# Patient Record
Sex: Female | Born: 1970 | Race: White | Hispanic: No | Marital: Married | Smoking: Former smoker
Health system: Southern US, Community
[De-identification: ages and names within clinical notes are randomized; demographics above are authoritative.]

## PROBLEM LIST (undated history)

## (undated) DIAGNOSIS — N393 Stress incontinence (female) (male): Secondary | ICD-10-CM

## (undated) DIAGNOSIS — Z973 Presence of spectacles and contact lenses: Secondary | ICD-10-CM

## (undated) DIAGNOSIS — E079 Disorder of thyroid, unspecified: Secondary | ICD-10-CM

## (undated) DIAGNOSIS — T7840XA Allergy, unspecified, initial encounter: Secondary | ICD-10-CM

## (undated) DIAGNOSIS — R519 Headache, unspecified: Secondary | ICD-10-CM

## (undated) DIAGNOSIS — Z972 Presence of dental prosthetic device (complete) (partial): Secondary | ICD-10-CM

## (undated) DIAGNOSIS — E039 Hypothyroidism, unspecified: Secondary | ICD-10-CM

## (undated) DIAGNOSIS — F419 Anxiety disorder, unspecified: Secondary | ICD-10-CM

## (undated) HISTORY — PX: OTHER SURGICAL HISTORY: SHX169

## (undated) HISTORY — PX: WISDOM TOOTH EXTRACTION: SHX21

## (undated) HISTORY — DX: Allergy, unspecified, initial encounter: T78.40XA

## (undated) HISTORY — DX: Disorder of thyroid, unspecified: E07.9

---

## 2001-11-02 ENCOUNTER — Other Ambulatory Visit: Admission: RE | Admit: 2001-11-02 | Discharge: 2001-11-02 | Payer: Self-pay | Admitting: Obstetrics and Gynecology

## 2002-11-09 ENCOUNTER — Other Ambulatory Visit: Admission: RE | Admit: 2002-11-09 | Discharge: 2002-11-09 | Payer: Self-pay | Admitting: Obstetrics and Gynecology

## 2005-01-11 ENCOUNTER — Other Ambulatory Visit: Admission: RE | Admit: 2005-01-11 | Discharge: 2005-01-11 | Payer: Self-pay | Admitting: Family Medicine

## 2006-01-23 ENCOUNTER — Other Ambulatory Visit: Admission: RE | Admit: 2006-01-23 | Discharge: 2006-01-23 | Payer: Self-pay | Admitting: Family Medicine

## 2007-09-13 ENCOUNTER — Emergency Department (HOSPITAL_COMMUNITY): Admission: EM | Admit: 2007-09-13 | Discharge: 2007-09-13 | Payer: Self-pay | Admitting: Emergency Medicine

## 2011-09-25 ENCOUNTER — Ambulatory Visit (INDEPENDENT_AMBULATORY_CARE_PROVIDER_SITE_OTHER): Payer: 59

## 2011-09-25 DIAGNOSIS — J111 Influenza due to unidentified influenza virus with other respiratory manifestations: Secondary | ICD-10-CM

## 2011-12-13 ENCOUNTER — Encounter: Payer: Self-pay | Admitting: Family Medicine

## 2011-12-13 ENCOUNTER — Ambulatory Visit (INDEPENDENT_AMBULATORY_CARE_PROVIDER_SITE_OTHER): Payer: 59 | Admitting: Family Medicine

## 2011-12-13 VITALS — BP 109/63 | HR 72 | Temp 97.9°F | Resp 16 | Ht 66.0 in | Wt 173.8 lb

## 2011-12-13 DIAGNOSIS — E663 Overweight: Secondary | ICD-10-CM

## 2011-12-13 DIAGNOSIS — E039 Hypothyroidism, unspecified: Secondary | ICD-10-CM

## 2011-12-13 MED ORDER — LEVOTHYROXINE SODIUM 125 MCG PO TABS: 125.0000 ug | ORAL_TABLET | Freq: Every day | ORAL | Status: DC

## 2011-12-13 NOTE — Patient Instructions (Signed)
Reading Food Labels Foods that are in packaging or containers will often have a Nutrition Facts panel on its side or back. The Nutrition Facts panel provides the nutritional value of the food. This information is helpful when determining healthy food choices. By reading food labels, you will find out the serving size of a food and how many servings the package has. You will also find information about the calorie and fat content, as well as the amount of carbohydrate, and vitamins and minerals. Food labels are a great reference for you to use to learn about the food you are eating. BREAKING DOWN THE FOOD LABEL Serving Size: The serving size is an amount of food and is often listed in cups, weight, or units. All of the nutrition information about the food is listed according to the serving size. If you double the serving size, you must double the amounts on the label.  Servings per [employer] or Package: The number of servings in the container is listed here.  Calories: The number of calories in one serving is listed here. Everyone needs a different amount of calories each day. Having calories listed on the label is helpful information for people who would like to keep track of the number of calories they eat to stay at a healthy weight. Calories from Fat:  The number of calories that come from fat in one serving are listed here.  NUTRIENTS THAT ARE LISTED ON THE FOOD LABEL.   Percent Daily Value: The food label helps you know if you are getting the amounts of nutrients you need each day by the percent daily value. It tells you how much of your daily values of each nutrient are provided by one serving of the food. The percent daily value is based on a 2000 calorie diet. You may need more or less than 2000 calories each day.   Total Fat: The total amount of fat in one serving is listed here. The number is shown in grams (g). This information is important for people who want to keep track of the amount of fat  in their diet. Foods with high amounts of fat usually have higher calories and may lead to weight gain.   Saturated Fat:  The amount of saturated fat in one serving is listed here. It is also shown in grams. Saturated fat is one type of fat that is found in food. It increases the amount of blood cholesterol more than other types of fat found in food. So saturated fat should be limited in the diet to less than 7 percent of total calories each day for most people. This means that if a person eats 2000 calories each day, they should eat less than 140 calories from saturated fat.   Trans Fat: The amount of trans fat in one serving is listed here. It is also shown in grams. Trans fat is another type of fat that is found in food. It should also be limited to less than 2 grams per day because it increases blood cholesterol.   Cholesterol: The amount of cholesterol in one serving is listed here. It is shown in milligrams (mg). Cholesterol should be limited to no more than 200 mg each day.   Sodium: The amount of sodium in one serving is listed here. It is shown in milligrams. American Heart Association recommends that sodium should be limited to 1500mg /day. This recommended level of sodium was recently lowered from 2400mg /day.   Total Carbohydrate: The amount of carbohydrate in  one serving is listed here. It is shown in grams. This information is important for people with diabetes because they need to manage the amount of carbohydrate they eat. Carbohydrate changes the amount of glucose or sugar in the blood and diabetics do not want that amount to be too high or too low.   Dietary Fiber:  The amount of dietary fiber in one serving is listed here. It is shown in grams. Fiber is a type of carbohydrate. Most people should eat 25 grams of dietary fiber each day.   Sugars: The amount of sugar in one serving is listed here. It is shown in grams. Sugars are also a type of carbohydrate. This value includes both  naturally occurring sugars from fruit and milk and added sugars such as honey or table sugar.   Protein: The amount of protein in one serving is listed here. It is shown in grams.   Vitamins and Minerals: Food labels list vitamin A, vitamin C, calcium and iron. They are all shown as a percent of the daily need one serving of the food provides. For example, if 15% is listed next to iron it means that one serving of that food will give you 15% of the total amount of iron you need for one day.   Calories per Gram: Some food labels will list the number of calories that are in each gram or protein, carbohydrate and fat. Protein has four calories per gram, carbohydrate has four calories per gram, and fat has 9 calories per gram.   Ingredients: Food labels will list each ingredient in the food. The first ingredient listed is the ingredient that the food has the most of. The ingredients are listed in the order of their amount from highest to lowest.   Contains: Food labels may also include this portion of the label as a food allergen warning. Listed here are ingredients that can cause allergies in some people. Examples of ingredients that are listed are wheat, dairy, eggs, soy and nuts. If a person knows that are allergic to one of these ingredients they will know not to eat the food in the container.  Information from www.eatright.[employer] Servant Nutritional Analysis Database, ADA Nutrition Care Manual. Document Released: 10/07/2005 Document Revised: 06/19/2011 Document Reviewed: 02/20/2009 [employer] Patient Information 2012 [employer]    Obesity Obesity is defined as having a body mass index (BMI) of 30 or more. To calculate your BMI divide your weight in pounds by your height in inches squared and multiply that product by 703. Major illnesses resulting from long-term obesity include:  Stroke.   Heart disease.   Diabetes.   Many cancers.   Arthritis.  Obesity also complicates recovery from  many other medical problems.  CAUSES   A history of obesity in your parents.   Thyroid hormone imbalance.   Environmental factors such as excess calorie intake and physical inactivity.  TREATMENT  A healthy weight loss program includes:  A calorie restricted diet based on individual calorie needs.   Increased physical activity (exercise).  An exercise program is just as important as the right low-calorie diet.  Weight-loss medicines should be used only under the supervision of your physician. These medicines help, but only if they are used with diet and exercise programs. Medicines can have side effects including nervousness, nausea, abdominal pain, diarrhea, headache, drowsiness, and depression.  An unhealthy weight loss program includes:  Fasting.   Fad diets.   Supplements and drugs.  These choices do not succeed  in long-term weight control.  HOME CARE INSTRUCTIONS  To help you make the needed dietary changes:   Exercise and perform physical activity as directed by your caregiver.   Keep a daily record of everything you eat. There are many free websites to help you with this. It may be helpful to measure your foods so you can determine if you are eating the correct portion sizes.   Use low-calorie cookbooks or take special cooking classes.   Avoid alcohol. Drink more water and drinks with no calories.   Take vitamins and supplements only as recommended by your caregiver.   Weight loss support groups, Registered Dieticians, counselors, and stress reduction education can also be very helpful.  Document Released: 11/14/2004 Document Revised: 06/19/2011 Document Reviewed: 09/13/2007 [facility] Patient Information 2012 [place], [place].    .Crohn's Disease Crohn's disease is a long-term (chronic) soreness and redness (inflammation) of the intestines (bowel). It can affect any portion of the digestive tract, from the mouth to the anus. It can also cause problems outside the  digestive tract. Crohn's disease is closely related to a disease called ulcerative colitis (together, these two diseases are called inflammatory bowel disease).  CAUSES  The cause of Crohn's disease is not known. One Nelva Bush is that, in an easily affected (susceptible) person, the immune system is triggered to attack the body's own digestive tissue. Crohn's disease runs in families. It seems to be more common in certain geographic areas and amongst certain races. There are no clear-cut dietary causes.  SYMPTOMS  Crohn's disease can cause many different symptoms since it can affect many different parts of the body. Symptoms include:  Fatigue.   Weight loss.   Chronic diarrhea, sometime bloody.   Abdominal pain and cramps.   Fever.   Ulcers or canker sores in the mouth or rectum.   Anemia (low red blood cells).   Arthritis, skin problems, and eye problems may occur.  Complications of Crohn's disease can include:  Series of holes (perforation) of the bowel.   Portions of the intestines sticking to each other (adhesions).   Obstruction of the bowel.   Fistula formation, typically in the rectal area but also in other areas. A fistula is an opening between the bowels and the outside, or between the bowels and another organ.   A painful crack in the mucous membrane of the anus (rectal fissure).  DIAGNOSIS  Your caregiver may suspect Crohn's disease based on your symptoms and an exam. Blood tests may confirm that there is a problem. You may be asked to submit a stool specimen for examination. X-rays and CT scans may be necessary. Ultimately, the diagnosis is usually made after a procedure that uses a flexible tube that is inserted via your mouth or your anus. This is done under sedation and is called either an upper endoscopy or colonoscopy. With these tests, the specialist can take tiny tissue samples and remove them from the inside of the bowel (biopsy). Examination of this biopsy tissue  under a microscope can reveal Crohn's disease as the cause of your symptoms. Due to the many different forms that Crohn's disease can take, symptoms may be present for several years before a diagnosis is made. HOME CARE INSTRUCTIONS   There is no cure for Crohn's disease. The best treatment is frequent checkups with your caregiver.   Symptoms such as diarrhea can be controlled with medications. Avoid foods that have a laxative effect such as fresh fruit, vegetables and dairy products. During flare ups,  you can rest your bowel by refraining from solid foods. Drink clear liquids frequently during the day (electrolyte or re-hydrating fluids are best. Your caregiver can help you with suggestions). Drink often to prevent loss of body fluids (dehydration). When diarrhea has cleared, eat small meals and more frequently. Avoid food additives and stimulants such as caffeine (coffee, tea, or chocolate). Enzyme supplements may help if you develop intolerance to a sugar in dairy products (lactose). Ask your caregiver or dietitian about specific dietary instructions.   Try to maintain a positive attitude. Learn relaxation techniques such as self hypnosis, mental imaging, and muscle relaxation.   If possible, avoid stresses which can aggravate your condition.   Exercise regularly.   Follow your diet.   Always get plenty of rest.  SEEK MEDICAL CARE IF:   Your symptoms fail to improve after a week or two of new treatment.   You experience continued weight loss.   You have ongoing crampy digestion or loose bowels.   You develop a new skin rash, skin sores, or eye problems.  SEEK IMMEDIATE MEDICAL CARE IF:   You have worsening of your symptoms or develop new symptoms.   You have a fever.   You develop bloody diarrhea.   You develop severe abdominal pain.  MAKE SURE YOU:   Understand these instructions.   Will watch your condition.   Will get help right away if you are not doing well or get  worse.  Document Released: 07/17/2005 Document Revised: 06/19/2011 Document Reviewed: 06/15/2007 [facility] Patient Information 2012 [place], [place].

## 2012-01-17 ENCOUNTER — Encounter: Payer: 59 | Admitting: Family Medicine

## 2012-02-14 ENCOUNTER — Ambulatory Visit (INDEPENDENT_AMBULATORY_CARE_PROVIDER_SITE_OTHER): Payer: 59 | Admitting: Family Medicine

## 2012-02-14 ENCOUNTER — Encounter: Payer: Self-pay | Admitting: Family Medicine

## 2012-02-14 VITALS — BP 122/78 | HR 71 | Temp 98.0°F | Resp 18 | Ht 66.5 in | Wt 176.4 lb

## 2012-02-14 DIAGNOSIS — R32 Unspecified urinary incontinence: Secondary | ICD-10-CM

## 2012-02-14 DIAGNOSIS — Z0000 Encounter for general adult medical examination without abnormal findings: Secondary | ICD-10-CM

## 2012-02-14 DIAGNOSIS — Z13220 Encounter for screening for lipoid disorders: Secondary | ICD-10-CM

## 2012-02-14 DIAGNOSIS — Z8669 Personal history of other diseases of the nervous system and sense organs: Secondary | ICD-10-CM

## 2012-02-14 DIAGNOSIS — J310 Chronic rhinitis: Secondary | ICD-10-CM

## 2012-02-14 DIAGNOSIS — E663 Overweight: Secondary | ICD-10-CM

## 2012-02-14 DIAGNOSIS — K5909 Other constipation: Secondary | ICD-10-CM

## 2012-02-14 DIAGNOSIS — K5904 Chronic idiopathic constipation: Secondary | ICD-10-CM

## 2012-02-14 DIAGNOSIS — Z113 Encounter for screening for infections with a predominantly sexual mode of transmission: Secondary | ICD-10-CM

## 2012-02-14 DIAGNOSIS — E039 Hypothyroidism, unspecified: Secondary | ICD-10-CM

## 2012-02-14 LAB — POCT URINALYSIS DIPSTICK
Bilirubin, UA: NEGATIVE
Blood, UA: NEGATIVE
Leukocytes, UA: NEGATIVE
Nitrite, UA: NEGATIVE
Protein, UA: NEGATIVE
Urobilinogen, UA: 1
pH, UA: 5.5

## 2012-02-14 NOTE — Patient Instructions (Signed)
Keeping You Healthy  Get These Tests 1. Blood Pressure- Have your blood pressure checked once a year by your health care provider.  Normal blood pressure is 120/80. 2. Weight- Have your body mass index (BMI) calculated to screen for obesity.  BMI is measure of body fat based on height and weight.  You can also calculate your own BMI at https://www.west-esparza.com/. 3. Cholesterol- Have your cholesterol checked every 5 years starting at age 41 then yearly starting at age 2. 4. Chlamydia, HIV, and other sexually transmitted diseases- Get screened every year until age 75, then within three months of each new sexual provider. 5. Pap Smear- Every 1-3 years; discuss with your health care provider. 6. Mammogram- Every year starting at age 45  Take these medicines  Calcium with Vitamin D-Your body needs 1200 mg of Calcium each day and [phone] IU of Vitamin D daily.  Your body can only absorb 500 mg of Calcium at a time so Calcium must be taken in 2 or 3 divided doses throughout the day.  Multivitamin with folic acid- Once daily if it is possible for you to become pregnant.  Get these Immunizations  Gardasil-Series of three doses; prevents HPV related illness such as genital warts and cervical cancer.  Menactra-Single dose; prevents meningitis.  Tetanus shot- Every 10 years.  Flu shot-Every year.  Take these steps 1. Do not smoke-Your healthcare provider can help you quit.  For tips on how to quit go to www.smokefree.gov or call 1-800 QUITNOW. 2. Be physically active- Exercise 5 days a week for at least 30 minutes.  If you are not already physically active, start slow and gradually work up to 30 minutes of moderate physical activity.  Examples of moderate activity include walking briskly, dancing, swimming, bicycling, etc. 3. Breast Cancer- A self breast exam every month is important for early detection of breast cancer.  For more information and instruction on self breast exams, ask your  healthcare provider or [email or web address]. 4. Eat a healthy diet- Eat a variety of healthy foods such as fruits, vegetables, whole grains, low fat milk, low fat cheeses, yogurt, lean meats, poultry and fish, beans, nuts, tofu, etc.  For more information go to www. Thenutritionsource.org 5. Drink alcohol in moderation- Limit alcohol intake to one drink or less per day. Never drink and drive. 6. Depression- Your emotional health is as important as your physical health.  If you're feeling down or losing interest in things you normally enjoy please talk to your healthcare provider about being screened for depression. 7. Dental visit- Brush and floss your teeth twice daily; visit your dentist twice a year. 8. Eye doctor- Get an eye exam at least every 2 years. 9. Helmet use- Always wear a helmet when riding a bicycle, motorcycle, rollerblading or skateboarding. 10. Safe sex- If you may be exposed to sexually transmitted infections, use a condom. 11. Seat belts- Seat belts can save your live; always wear one. 12. Smoke/Carbon Monoxide detectors- These detectors need to be installed on the appropriate level of your home. Replace batteries at least once a year. 13. Skin cancer- When out in the sun please cover up and use sunscreen 15 SPF or higher. 14. Violence- If anyone is threatening or hurting you, please tell your healthcare provider.   Constipation- see additional information provided to you. Changing your diet and trying to be active helps colon function properly. You can try OTC Magnesium citrate solution to"clean out" your colon. There are a number of reasons people experience  chronic constipation. Increasing your fluid intake, especially with water and healthy fruit juices as well as eating fruits,vegetables and whole grains can improve your colon health. If you need to see a GI specialist, you will be notified.
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
## 2012-02-15 LAB — COMPREHENSIVE METABOLIC PANEL
Alkaline Phosphatase: 44 U/L (ref 39–117)
BUN: 9 mg/dL (ref 6–23)
Creat: 0.68 mg/dL (ref 0.50–1.10)
Glucose, Bld: 79 mg/dL (ref 70–99)
Sodium: 137 mEq/L (ref 135–145)
Total Bilirubin: 1.3 mg/dL — ABNORMAL HIGH (ref 0.3–1.2)
Total Protein: 6.7 g/dL (ref 6.0–8.3)

## 2012-02-15 LAB — CBC WITH DIFFERENTIAL/PLATELET
Basophils Absolute: 0 10*3/uL (ref 0.0–0.1)
Eosinophils Absolute: 0.1 10*3/uL (ref 0.0–0.7)
Eosinophils Relative: 1 % (ref 0–5)
HCT: 43.6 % (ref 36.0–46.0)
MCH: 31.5 pg (ref 26.0–34.0)
MCHC: 33 g/dL (ref 30.0–36.0)
MCV: 95.4 fL (ref 78.0–100.0)
Monocytes Absolute: 0.5 10*3/uL (ref 0.1–1.0)
Platelets: 241 10*3/uL (ref 150–400)
RDW: 12.4 % (ref 11.5–15.5)

## 2012-02-15 LAB — RPR

## 2012-02-15 LAB — LIPID PANEL
Cholesterol: 167 mg/dL (ref 0–200)
HDL: 53 mg/dL (ref >39)
Total CHOL/HDL Ratio: 3.2 Ratio
Triglycerides: 60 mg/dL (ref <150)

## 2012-02-18 ENCOUNTER — Encounter: Payer: Self-pay | Admitting: Family Medicine

## 2012-02-18 DIAGNOSIS — J310 Chronic rhinitis: Secondary | ICD-10-CM | POA: Insufficient documentation

## 2012-02-18 DIAGNOSIS — Z0000 Encounter for general adult medical examination without abnormal findings: Secondary | ICD-10-CM | POA: Insufficient documentation

## 2012-02-18 DIAGNOSIS — K5904 Chronic idiopathic constipation: Secondary | ICD-10-CM | POA: Insufficient documentation

## 2012-02-18 DIAGNOSIS — E039 Hypothyroidism, unspecified: Secondary | ICD-10-CM | POA: Insufficient documentation

## 2012-02-18 DIAGNOSIS — Z8669 Personal history of other diseases of the nervous system and sense organs: Secondary | ICD-10-CM | POA: Insufficient documentation

## 2012-02-18 DIAGNOSIS — E663 Overweight: Secondary | ICD-10-CM | POA: Insufficient documentation

## 2012-02-18 NOTE — Progress Notes (Signed)
Quick Note:  Please call pt and advise that the following labs are abnormal...  Vitamin D level is above normal; if you are taking a supplement, reduce the dose.  Thyroid test indicate adequate supplementation (right dose of tablet); this should be rechecked in 3-4 months. Cholesterol profile: numbers are good except LDL ("bad") cholesterol Is a little above normal. Improve your nutrition as we discussed, esp. with more fruits and vegetables and fiber.  Copy to pt. ______

## 2012-02-19 ENCOUNTER — Other Ambulatory Visit: Payer: Self-pay

## 2012-02-19 MED ORDER — LEVOTHYROXINE SODIUM 125 MCG PO TABS: 125.0000 ug | ORAL_TABLET | Freq: Every day | ORAL | Status: DC

## 2012-02-21 ENCOUNTER — Encounter: Payer: Self-pay | Admitting: Family Medicine

## 2012-02-21 NOTE — Progress Notes (Signed)
  Subjective:    Patient ID: Renee Mendez, female    DOB: September 08, 1971, 41 y.o.   MRN: [id number]  HPI  This pt is here for medication review and RF of thyroid medication. Her energy level is good but she  does have occasional fatigue with minor sleep difficulties. She has chronic constipation (nutrition needs  improvement). She has no significant changes in skin or hair.    Review of Systems  Constitutional: Positive for fatigue. Negative for unexpected weight change.  HENT: Negative.   Respiratory: Negative for chest tightness and shortness of breath.   Cardiovascular: Negative for chest pain and leg swelling.  Gastrointestinal: Positive for constipation.  Genitourinary: Negative for menstrual problem.  Skin: Negative.   Neurological: Negative for dizziness, weakness, numbness and headaches.       Objective:   Physical Exam  Nursing note and vitals reviewed. Constitutional: She is oriented to person, place, and time. She appears well-developed and well-nourished. No distress.  HENT:  Head: Normocephalic and atraumatic.  Eyes: Conjunctivae and EOM are normal. No scleral icterus.  Neck: No thyromegaly present.  Cardiovascular: Normal rate.   Pulmonary/Chest: Effort normal. No respiratory distress.  Neurological: She is alert and oriented to person, place, and time. No cranial nerve deficit.  Skin: Skin is warm and dry.  Psychiatric: She has a normal mood and affect. Her behavior is normal.          Assessment & Plan:   1. Hypothyroidism    Continue current dose of Levothyroxine pending labs at next visit  2. Overweight (BMI 25.0-29.9)      Encouraged healthier nutrition and regular exercise for weight loss

## 2012-03-31 ENCOUNTER — Encounter: Payer: Self-pay | Admitting: Gastroenterology

## 2012-04-02 ENCOUNTER — Other Ambulatory Visit: Payer: Self-pay | Admitting: Obstetrics and Gynecology

## 2012-04-02 DIAGNOSIS — Z1231 Encounter for screening mammogram for malignant neoplasm of breast: Secondary | ICD-10-CM

## 2012-04-24 ENCOUNTER — Ambulatory Visit: Payer: Self-pay | Admitting: Gastroenterology

## 2012-05-01 ENCOUNTER — Ambulatory Visit: Payer: Self-pay

## 2012-05-08 ENCOUNTER — Ambulatory Visit
Admission: RE | Admit: 2012-05-08 | Discharge: 2012-05-08 | Disposition: A | Discharge status: Home / Self Care | Payer: 59 | Source: Ambulatory Visit | Attending: Obstetrics and Gynecology | Admitting: Obstetrics and Gynecology

## 2012-05-08 DIAGNOSIS — Z1231 Encounter for screening mammogram for malignant neoplasm of breast: Secondary | ICD-10-CM

## 2012-05-18 ENCOUNTER — Telehealth: Payer: Self-pay | Admitting: *Deleted

## 2012-05-18 ENCOUNTER — Encounter: Payer: Self-pay | Admitting: Internal Medicine

## 2012-05-18 NOTE — Telephone Encounter (Signed)
Pt has constipation and lower abd pain request to be seen sooner due to increasing pain.  Appt scheduled with Dr Rhea Belton

## 2012-05-20 ENCOUNTER — Ambulatory Visit (INDEPENDENT_AMBULATORY_CARE_PROVIDER_SITE_OTHER): Payer: 59 | Admitting: Internal Medicine

## 2012-05-20 ENCOUNTER — Encounter: Payer: Self-pay | Admitting: Internal Medicine

## 2012-05-20 VITALS — BP 122/78 | HR 76 | Ht 65.0 in | Wt 177.4 lb

## 2012-05-20 DIAGNOSIS — K5900 Constipation, unspecified: Secondary | ICD-10-CM

## 2012-05-20 DIAGNOSIS — R109 Unspecified abdominal pain: Secondary | ICD-10-CM

## 2012-05-20 DIAGNOSIS — K625 Hemorrhage of anus and rectum: Secondary | ICD-10-CM

## 2012-05-20 DIAGNOSIS — R1030 Lower abdominal pain, unspecified: Secondary | ICD-10-CM

## 2012-05-20 MED ORDER — PEG-KCL-NACL-NASULF-NA ASC-C 100 G PO SOLR: 1.0000 | Freq: Once | ORAL | Status: DC

## 2012-05-20 MED ORDER — LINACLOTIDE 290 MCG PO CAPS: 290.0000 ug | ORAL_CAPSULE | Freq: Every day | ORAL | Status: DC

## 2012-05-20 NOTE — Patient Instructions (Addendum)
You have been scheduled for a colonoscopy with propofol. Please follow written instructions given to you at your visit today.  Please pick up your prep kit at the pharmacy within the next 1-3 days. If you use inhalers (even only as needed), please bring them with you on the day of your procedure.  We have sent the following medications to your pharmacy for you to pick up at your convenience: Moviprep; you were given instructions at your appointment today.  Linzess, please take as dirested

## 2012-05-20 NOTE — Progress Notes (Signed)
Patient ID: Renee Mendez, female   DOB: 04/03/1971, 41 y.o.   MRN: [id number]  SUBJECTIVE: HPI [name] is a 41 yo female with hx of thyroid disease who seen for evaluation of constipation. The patient is alone today. The patient reports she has a long-standing history of chronic constipation, but this is worsened over the last 5 months. Normal for her was a bowel movement once or twice per week, but over the last 4-5 months she has had less frequent bowel movements. She can go as long as 2 or 3 weeks without defecation. She's tried multiple laxatives including MiraLAX which she took for 5 day period, Dulcolax suppositories, Metamucil, and magnesium citrate. She did get some relief with MiraLAX after 5 days but was incomplete. Also in the last 4-5 months she reports significant lower abdominal bloating, increased lower GI gas, and dyspareunia. She also reports lower abdominal cramping pain which is sometimes relieved with bowel movement. The pain can be across her lower abdomen and at times is concentrated in her left lower quadrant.  Her last bowel movement was today, but she thinks this was due to the nerves associated with this appointment. She denies weight loss. She's noted some mild nausea without vomiting. No heartburn. No dysphagia. No fevers or chills. She does report issues with hemorrhoids and intermittent bleeding. She notes red blood per rectum occurring less than once a week.  Review of Systems  As per history of present illness, otherwise negative   Past Medical History  Diagnosis Date  . Thyroid disease     Current Outpatient Prescriptions  Medication Sig Dispense Refill  . levothyroxine (SYNTHROID, LEVOTHROID) 125 MCG tablet Take 1 tablet (125 mcg total) by mouth daily.  90 tablet  1  . loratadine (CLARITIN) 10 MG tablet Take 10 mg by mouth daily.      . Norethindrone-Eth Estradiol (NORTREL 1/35, 21, PO) Take by mouth daily.      . Linaclotide (LINZESS) 290 MCG CAPS Take 290  mcg by mouth daily.  30 capsule  5  . peg 3350 powder (MOVIPREP) 100 G SOLR Take 1 kit (100 g total) by mouth once.  1 kit  0    Allergies  Allergen Reactions  . Tapazole (Methimazole)     Severe joint pain    Family History  Problem Relation Age of Onset  . Hypothyroidism Maternal Grandmother   . Graves' disease Mother   . Breast cancer Paternal Aunt   . Brain cancer Paternal Grandmother     Tumor  . Colon polyps Mother     History  Substance Use Topics  . Smoking status: Former Games developer  . Smokeless tobacco: Never Used  . Alcohol Use: Yes    OBJECTIVE: BP 122/78  Pulse 76  Ht 5\' 5"  (1.651 m)  Wt 177 lb 6.4 oz (80.468 kg)  BMI 29.52 kg/m2  LMP 04/20/2012 Constitutional: Well-developed and well-nourished. No distress. HEENT: Normocephalic and atraumatic. Oropharynx is clear and moist. No oropharyngeal exudate. Conjunctivae are normal. Pupils are equal round and reactive to light. No scleral icterus. Neck: Neck supple. Trachea midline. Cardiovascular: Normal rate, regular rhythm and intact distal pulses. No M/R/G Pulmonary/chest: Effort normal and breath sounds normal. No wheezing, rales or rhonchi. Abdominal: Soft, lower abdominal pain to deep palpation without rebound or guarding, nondistended. Bowel sounds active throughout. There are no masses palpable. No hepatosplenomegaly. Extremities: no clubbing, cyanosis, or edema Lymphadenopathy: No cervical adenopathy noted. Neurological: Alert and oriented to person place and time. Skin:  Skin is warm and dry. No rashes noted. Psychiatric: Normal mood and affect. Behavior is normal.  Labs and Imaging -- CBC    Component Value Date/Time   WBC 8.7 02/14/2012 1514   RBC 4.57 02/14/2012 1514   HGB 14.4 02/14/2012 1514   HCT 43.6 02/14/2012 1514   PLT 241 02/14/2012 1514   MCV 95.4 02/14/2012 1514   MCH 31.5 02/14/2012 1514   MCHC 33.0 02/14/2012 1514   RDW 12.4 02/14/2012 1514   LYMPHSABS 2.8 02/14/2012 1514   MONOABS 0.5  02/14/2012 1514   EOSABS 0.1 02/14/2012 1514   BASOSABS 0.0 02/14/2012 1514    CMP     Component Value Date/Time   NA 137 02/14/2012 1514   K 3.7 02/14/2012 1514   CL 101 02/14/2012 1514   CO2 25 02/14/2012 1514   GLUCOSE 79 02/14/2012 1514   BUN 9 02/14/2012 1514   CREATININE 0.68 02/14/2012 1514   CALCIUM 9.2 02/14/2012 1514   PROT 6.7 02/14/2012 1514   ALBUMIN 4.0 02/14/2012 1514   AST 15 02/14/2012 1514   ALT 11 02/14/2012 1514   ALKPHOS 44 02/14/2012 1514   BILITOT 1.3* 02/14/2012 1514    ASSESSMENT AND PLAN: 41 yo female with hx of thyroid disease who seen for evaluation of constipation  1. Constipation/intermittent rectal bleeding -- the patient's symptoms seem most consistent with chronic constipation possibly irritable bowel related, but the rectal bleeding is not a trivial to irritable bowel. Certainly rectal bleeding could be hemorrhoidal in nature, but given the fact that her bowel habits have changed in the last 5 months with the development of abdominal pain, I recommended colonoscopy. We discussed the risks and benefits and she is agreeable to proceed. I will also prescribe LINZESS 290 mcg daily. We discussed how this medication works well for constipation along with abdominal pain associated with irritable bowel. Further recommendations to be made after colonoscopy, and if she has a positive response to [facility] this can be continued chronically. Patient is happy with this plan

## 2012-05-21 ENCOUNTER — Telehealth: Payer: Self-pay | Admitting: Internal Medicine

## 2012-05-28 ENCOUNTER — Telehealth: Payer: Self-pay | Admitting: Gastroenterology

## 2012-05-28 NOTE — Telephone Encounter (Signed)
Faxed in prior auth for Linzess.

## 2012-05-28 NOTE — Telephone Encounter (Signed)
LVm for  pt to let her know I am working in her Prior auth. For LInzess And that I will leave samples at our front desk.

## 2012-05-29 ENCOUNTER — Ambulatory Visit: Payer: Self-pay | Admitting: Gastroenterology

## 2012-06-04 NOTE — Telephone Encounter (Signed)
See message from 05-28-12

## 2012-06-15 ENCOUNTER — Encounter: Payer: 59 | Admitting: Internal Medicine

## 2012-06-29 ENCOUNTER — Encounter: Payer: 59 | Admitting: Internal Medicine

## 2012-06-29 ENCOUNTER — Ambulatory Visit (AMBULATORY_SURGERY_CENTER): Payer: 59 | Admitting: Internal Medicine

## 2012-06-29 ENCOUNTER — Encounter: Payer: Self-pay | Admitting: Internal Medicine

## 2012-06-29 VITALS — BP 149/90 | HR 66 | Temp 98.3°F | Resp 18 | Ht 65.0 in | Wt 177.0 lb

## 2012-06-29 DIAGNOSIS — K635 Polyp of colon: Secondary | ICD-10-CM

## 2012-06-29 DIAGNOSIS — D126 Benign neoplasm of colon, unspecified: Secondary | ICD-10-CM

## 2012-06-29 DIAGNOSIS — R198 Other specified symptoms and signs involving the digestive system and abdomen: Secondary | ICD-10-CM

## 2012-06-29 DIAGNOSIS — R194 Change in bowel habit: Secondary | ICD-10-CM

## 2012-06-29 DIAGNOSIS — K625 Hemorrhage of anus and rectum: Secondary | ICD-10-CM

## 2012-06-29 DIAGNOSIS — K5904 Chronic idiopathic constipation: Secondary | ICD-10-CM

## 2012-06-29 DIAGNOSIS — K5909 Other constipation: Secondary | ICD-10-CM

## 2012-06-29 DIAGNOSIS — K5900 Constipation, unspecified: Secondary | ICD-10-CM

## 2012-06-29 DIAGNOSIS — R109 Unspecified abdominal pain: Secondary | ICD-10-CM

## 2012-06-29 MED ORDER — SODIUM CHLORIDE 0.9 % IV SOLN: 500.0000 mL | INTRAVENOUS | Status: DC

## 2012-06-29 NOTE — Patient Instructions (Addendum)
Findings:  Polyp, small internal hemorrhoids Recommendations:  Await pathology results  YOU HAD AN ENDOSCOPIC PROCEDURE TODAY AT THE [facility]: Refer to the procedure report that was given to you for any specific questions about what was found during the examination.  If the procedure report does not answer your questions, please call your gastroenterologist to clarify.  If you requested that your care partner not be given the details of your procedure findings, then the procedure report has been included in a sealed envelope for you to review at your convenience later.  YOU SHOULD EXPECT: Some feelings of bloating in the abdomen. Passage of more gas than usual.  Walking can help get rid of the air that was put into your GI tract during the procedure and reduce the bloating. If you had a lower endoscopy (such as a colonoscopy or flexible sigmoidoscopy) you may notice spotting of blood in your stool or on the toilet paper. If you underwent a bowel prep for your procedure, then you may not have a normal bowel movement for a few days.  DIET: Your first meal following the procedure should be a light meal and then it is ok to progress to your normal diet.  A half-sandwich or bowl of soup is an example of a good first meal.  Heavy or fried foods are harder to digest and may make you feel nauseous or bloated.  Likewise meals heavy in dairy and vegetables can cause extra gas to form and this can also increase the bloating.  Drink plenty of fluids but you should avoid alcoholic beverages for 24 hours.  ACTIVITY: Your care partner should take you home directly after the procedure.  You should plan to take it easy, moving slowly for the rest of the day.  You can resume normal activity the day after the procedure however you should NOT DRIVE or use heavy machinery for 24 hours (because of the sedation medicines used during the test).    SYMPTOMS TO REPORT IMMEDIATELY: A gastroenterologist can be  reached at any hour.  During normal business hours, 8:30 AM to 5:00 PM Monday through Friday, call [phone].  After hours and on weekends, please call the GI answering service at [phone] who will take a message and have the physician on call contact you.   Following lower endoscopy (colonoscopy or flexible sigmoidoscopy):  Excessive amounts of blood in the stool  Significant tenderness or worsening of abdominal pains  Swelling of the abdomen that is new, acute  Fever of 100F or higher  Following upper endoscopy (EGD)  Vomiting of blood or coffee ground material  New chest pain or pain under the shoulder blades  Painful or persistently difficult swallowing  New shortness of breath  Fever of 100F or higher  Black, tarry-looking stools  FOLLOW UP: If any biopsies were taken you will be contacted by phone or by letter within the next 1-3 weeks.  Call your gastroenterologist if you have not heard about the biopsies in 3 weeks.  Our staff will call the home number listed on your records the next business day following your procedure to check on you and address any questions or concerns that you may have at that time regarding the information given to you following your procedure. This is a courtesy call and so if there is no answer at the home number and we have not heard from you through the emergency physician on call, we will assume that you have returned to  your regular daily activities without incident.  SIGNATURES/CONFIDENTIALITY: You and/or your care partner have signed paperwork which will be entered into your electronic medical record.  These signatures attest to the fact that that the information above on your After Visit Summary has been reviewed and is understood.  Full responsibility of the confidentiality of this discharge information lies with you and/or your care-partner.   Please follow all discharge instructions given to you by the recovery room nurse. If you have  any questions or problems after discharge please call one of the numbers listed above. You will receive a phone call in the am to see how you are doing and answer any questions you may have. Thank you for choosing [facility] for your health care needs.

## 2012-06-29 NOTE — Progress Notes (Signed)
Patient did not experience any of the following events: a burn prior to discharge; a fall within the facility; wrong site/side/patient/procedure/implant event; or a hospital transfer or hospital admission upon discharge from the facility. (G8907) Patient did not have preoperative order for IV antibiotic SSI prophylaxis. (G8918)  

## 2012-06-29 NOTE — Op Note (Signed)
[facility] [address] [place], [address]   COLONOSCOPY PROCEDURE REPORT  PATIENT: Renee Mendez, Renee Mendez  MR#: [id number] BIRTHDATE: 13-Aug-1971 , 40  yrs. old GENDER: Female ENDOSCOPIST: Beverley Fiedler, MD PROCEDURE DATE:  06/29/2012 PROCEDURE:   Colonoscopy with snare polypectomy ASA CLASS:   Class II INDICATIONS:rectal bleeding, change in bowel habits, constipation, and first colonoscopy. MEDICATIONS: MAC sedation, administered by CRNA and propofol (Diprivan) 300mg  IV  DESCRIPTION OF PROCEDURE:   After the risks benefits and alternatives of the procedure were thoroughly explained, informed consent was obtained.  A digital rectal exam revealed a skin tag. The LB [id number]  endoscope was introduced through the anus and advanced to the terminal ileum which was intubated for a short distance. No adverse events experienced.   The quality of the prep was good, using MoviPrep  The instrument was then slowly withdrawn as the colon was fully examined.   COLON FINDINGS: The mucosa appeared normal in the terminal ileum. A sessile polyp measuring 4 mm in size was found in the sigmoid colon.  A polypectomy was performed with a cold snare.  The resection was complete and the polyp tissue was completely retrieved.  Otherwise normal examination of the colon. Small internal hemorrhoids were found. Retroflexion revealed findings as previously described. The time to cecum=4 minutes 35 seconds. Withdrawal time=8 minutes 40 seconds.  The scope was withdrawn and the procedure completed.  COMPLICATIONS: There were no complications.  ENDOSCOPIC IMPRESSION: 1.   Normal mucosa in the terminal ileum 2.   Sessile polyp measuring 4 mm in size was found in the sigmoid colon; polypectomy was performed with a cold snare 3.   Small internal hemorrhoids  RECOMMENDATIONS: 1.  Await pathology results 2.  If the polyp removed today is proven to be adenomatous (pre-cancerous)  polyp, you will need a repeat colonoscopy in 5 years.  Otherwise you should continue to follow colorectal cancer screening guidelines for "routine risk" patients with colonoscopy in 10 years.  You will receive a letter within 1-2 weeks with the results of your biopsy as well as final recommendations.  Please call my office if you have not received a letter after 3 weeks.   eSigned:  Beverley Fiedler, MD 06/29/2012 11:03 AM   cc: The Patient   PATIENT NAME:  Renee Mendez, Renee Mendez MR#: [id number]

## 2012-06-30 ENCOUNTER — Telehealth: Payer: Self-pay

## 2012-06-30 NOTE — Telephone Encounter (Signed)
  Follow up Call-  Call back number 06/29/2012  Post procedure Call Back phone  # [phone]  Permission to leave phone message No     Patient questions:  Do you have a fever, pain , or abdominal swelling? no Pain Score  0 *  Have you tolerated food without any problems? yes  Have you been able to return to your normal activities? yes  Do you have any questions about your discharge instructions: Diet   no Medications  no Follow up visit  no  Do you have questions or concerns about your Care? no  Actions: * If pain score is 4 or above: No action needed, pain <4.

## 2012-07-01 ENCOUNTER — Telehealth: Payer: Self-pay | Admitting: Internal Medicine

## 2012-07-01 NOTE — Telephone Encounter (Signed)
Informed pt she needs to walk and increase her fluids intake an eventually the gas will work itself out. Pt reports she has not had a BM since the prep. She will call for further problems.

## 2012-07-03 ENCOUNTER — Encounter: Payer: Self-pay | Admitting: Internal Medicine

## 2012-09-15 ENCOUNTER — Other Ambulatory Visit: Payer: Self-pay | Admitting: *Deleted

## 2012-09-15 ENCOUNTER — Other Ambulatory Visit: Payer: Self-pay | Admitting: Family Medicine

## 2012-09-15 MED ORDER — LEVOTHYROXINE SODIUM 125 MCG PO TABS: 125.0000 ug | ORAL_TABLET | Freq: Every day | ORAL | Status: DC

## 2012-09-15 NOTE — Telephone Encounter (Signed)
PT ST

## 2012-09-15 NOTE — Telephone Encounter (Signed)
PT STATES SHE IS OUT OF HER MEDICINE AND IS GOING OUT OF TOWN. NEED REFILLS AND I'M NOT SURE OF THE NAME SHE HAD GIVEN ME PLEASE CALL [phone]    [facility] [address]

## 2012-10-15 ENCOUNTER — Telehealth: Payer: Self-pay

## 2012-10-15 MED ORDER — LEVOTHYROXINE SODIUM 125 MCG PO TABS: 125.0000 ug | ORAL_TABLET | Freq: Every day | ORAL | Status: DC

## 2012-10-15 NOTE — Telephone Encounter (Signed)
Sent in for her, called her to advise.  

## 2012-10-15 NOTE — Telephone Encounter (Signed)
Pt had to reschedule appt with dr Audria Nine, but will need thyroid rx refilled now. Please call pt to advise

## 2012-10-16 ENCOUNTER — Ambulatory Visit: Payer: 59 | Admitting: Family Medicine

## 2012-10-30 ENCOUNTER — Ambulatory Visit: Payer: 59 | Admitting: Family Medicine

## 2012-11-13 ENCOUNTER — Ambulatory Visit (INDEPENDENT_AMBULATORY_CARE_PROVIDER_SITE_OTHER): Payer: 59 | Admitting: Family Medicine

## 2012-11-13 ENCOUNTER — Encounter: Payer: Self-pay | Admitting: Family Medicine

## 2012-11-13 VITALS — BP 114/70 | HR 70 | Temp 98.8°F | Resp 16 | Ht 66.5 in | Wt 178.2 lb

## 2012-11-13 DIAGNOSIS — R0981 Nasal congestion: Secondary | ICD-10-CM

## 2012-11-13 DIAGNOSIS — J309 Allergic rhinitis, unspecified: Secondary | ICD-10-CM

## 2012-11-13 DIAGNOSIS — E039 Hypothyroidism, unspecified: Secondary | ICD-10-CM

## 2012-11-13 MED ORDER — FLUTICASONE PROPIONATE 50 MCG/ACT NA SUSP: 2.0000 | Freq: Every day | NASAL | Status: DC

## 2012-11-13 MED ORDER — LEVOTHYROXINE SODIUM 125 MCG PO TABS: 125.0000 ug | ORAL_TABLET | Freq: Every day | ORAL | Status: DC

## 2012-11-13 NOTE — Progress Notes (Signed)
S:  This 42 y.o. Cauc female has Hypothyroidism and needs labs checked today. She feels good and has no c/o skin changes , constipation, abnormal weight gain; she has gained 2-3 pounds but plans to start going to the [employer] regularly w/ her boyfriend.   Pt was seen and treated at [facility]"  Recently for sinus congestion, cough and ear pain. She finished the antibiotic but ear still feels congested; this improves when ear pops.COugh has resolved.  She has chronic left ear problems related to previous infections, multiple surgeries and scarring. She has AR for which she takes Claritin as needed. Did use a Fluticasone nasal spray many years ago.  ROS: As per HPI; negative for fever/ chills, sinus pain, sore throat, voice change, SOB or cough, wheezing, CP or tightness, n/v, HA, dizziness or problems w/ equilibrium or syncope.  O:  Filed Vitals:   11/13/12 1406  BP: 114/70  Pulse: 70  Temp: 98.8 F (37.1 C)  Resp: 16   GEN: In NAD; WN/WD. HEENT: [place]/AT. EOMI w/ clear conj/sclerae. EACs normal. L TM dull w/ scarring; R TM normal. Post ph erythematous w/ cobblestoning. Sinuses nontender. NECK: Supple w/o LAN or TMG. COR: RRR. LUNGS: Normal resp rate and effort. SKIN: W&D. No rashes or pallor. NEURO: A&O x 3; Cns intact. Nonfocal.  A/P: 1. Hypothyroidism  TSH, T3, Free  2. Allergic rhinitis  Take Claritin every day Use Fluticasone every night Try humidifier.  3. Sinus congestion  Limit exposure to allergens (dust, etc.)   Meds ordered this encounter  Medications  . citalopram (CELEXA) 10 MG tablet    Sig: Take 10 mg by mouth daily.  . fluticasone (FLONASE) 50 MCG/ACT nasal spray    Sig: Place 2 sprays into the nose daily.    Dispense:  16 g    Refill:  6  . levothyroxine (SYNTHROID, LEVOTHROID) 125 MCG tablet    Sig: Take 1 tablet (125 mcg total) by mouth daily.    Dispense:  30 tablet    Refill:  5

## 2012-11-13 NOTE — Patient Instructions (Addendum)
Allergic Rhinitis Allergic rhinitis is when the mucous membranes in the nose respond to allergens. Allergens are particles in the air that cause your body to have an allergic reaction. This causes you to release allergic antibodies. Through a chain of events, these eventually cause you to release histamine into the blood stream (hence the use of antihistamines). Although meant to be protective to the body, it is this release that causes your discomfort, such as frequent sneezing, congestion and an itchy runny nose.  CAUSES  The pollen allergens may come from grasses, trees, and weeds. This is seasonal allergic rhinitis, or "hay fever." Other allergens cause year-round allergic rhinitis (perennial allergic rhinitis) such as house dust mite allergen, pet dander and mold spores.  SYMPTOMS   Nasal stuffiness (congestion).  Runny, itchy nose with sneezing and tearing of the eyes.  There is often an itching of the mouth, eyes and ears. It cannot be cured, but it can be controlled with medications. DIAGNOSIS  If you are unable to determine the offending allergen, skin or blood testing may find it. TREATMENT   Avoid the allergen.  Medications and allergy shots (immunotherapy) can help.  Hay fever may often be treated with antihistamines in pill or nasal spray forms. Antihistamines block the effects of histamine. There are over-the-counter medicines that may help with nasal congestion and swelling around the eyes. Check with your caregiver before taking or giving this medicine. If the treatment above does not work, there are many new medications your caregiver can prescribe. Stronger medications may be used if initial measures are ineffective. Desensitizing injections can be used if medications and avoidance fails. Desensitization is when a patient is given ongoing shots until the body becomes less sensitive to the allergen. Make sure you follow up with your caregiver if problems continue. SEEK MEDICAL  CARE IF:   You develop fever (more than 100.5 F (38.1 C).  You develop a cough that does not stop easily (persistent).  You have shortness of breath.  You start wheezing.  Symptoms interfere with normal daily activities. Document Released: 07/02/2001 Document Revised: 12/30/2011 Document Reviewed: 01/11/2009 [facility] Patient Information 2013 [place], [place].     Serous Otitis Media  Serous otitis media is also known as otitis media with effusion (OME). It means there is fluid in the middle ear space. This space contains the bones for hearing and air. Air in the middle ear space helps to transmit sound.  The air gets there through the eustachian tube. This tube goes from the back of the throat to the middle ear space. It keeps the pressure in the middle ear the same as the outside world. It also helps to drain fluid from the middle ear space. CAUSES  OME occurs when the eustachian tube gets blocked. Blockage can come from:  Ear infections.  Colds and other upper respiratory infections.  Allergies.  Irritants such as cigarette smoke.  Sudden changes in air pressure (such as descending in an airplane).  Enlarged adenoids. During colds and upper respiratory infections, the middle ear space can become temporarily filled with fluid. This can happen after an ear infection also. Once the infection clears, the fluid will generally drain out of the ear through the eustachian tube. If it does not, then OME occurs. SYMPTOMS   Hearing loss.  A feeling of fullness in the ear  but no pain.  Young children may not show any symptoms. DIAGNOSIS   Diagnosis of OME is made by an ear exam.  Tests may  be done to check on the movement of the eardrum.  Hearing exams may be done. TREATMENT   The fluid most often goes away without treatment.  If allergy is the cause, allergy treatment may be helpful.  Fluid that persists for several months may require minor surgery. A small tube is  placed in the ear drum to:  Drain the fluid.  Restore the air in the middle ear space.  In certain situations, antibiotics are used to avoid surgery.  Surgery may be done to remove enlarged adenoids (if this is the cause). HOME CARE INSTRUCTIONS   Keep children away from tobacco smoke.  Be sure to keep follow up appointments, if any. SEEK MEDICAL CARE IF:   Hearing is not better in 3 months.  Hearing is worse.  Ear pain.  Drainage from the ear.  Dizziness. Document Released: 12/28/2003 Document Revised: 12/30/2011 Document Reviewed: 10/27/2008 [facility] Patient Information 2013 [place], [place].   Take Claritin every day, use the prescribed nasal spray every night and try a humidifier most nights to see if you have any improvement.

## 2012-11-14 NOTE — Progress Notes (Signed)
Quick Note:  Please call pt and advise that the following labs are abnormal...  Thyroid test results- even though your TSH is low (meaning you may be taking to much thyroid medication), your T3 level is in the normal range. I am not going to change your medication dose. These tests will need to monitored on a routine basis. Thyroid levels can be affected by oral contraception so do not take these 2 medications together.  Copy to pt. ______

## 2013-03-04 ENCOUNTER — Telehealth: Payer: Self-pay | Admitting: Internal Medicine

## 2013-03-04 NOTE — Telephone Encounter (Signed)
Spoke to pt. Told her I am starting a prior auth for Linzess, I offered her samples as well, because auth could take 24-72 hours and we are going into the weekend.  Pt was thankful and will be in tomorrow to get samples

## 2013-05-24 ENCOUNTER — Other Ambulatory Visit: Payer: Self-pay | Admitting: Family Medicine

## 2013-05-24 ENCOUNTER — Telehealth: Payer: Self-pay

## 2013-05-24 DIAGNOSIS — E039 Hypothyroidism, unspecified: Secondary | ICD-10-CM

## 2013-05-24 NOTE — Telephone Encounter (Signed)
Pt left vmail requesting referral to Dr Debara Pickett for endocrinology for her thyroid.  She provided Dr Daune Perch phone and fax  Phone:[phone] Fax: [phone]  Pt best: [phone]  bf

## 2013-05-24 NOTE — Telephone Encounter (Signed)
Med encounter

## 2013-05-25 NOTE — Telephone Encounter (Signed)
Referral ordered per pt preference.

## 2013-06-25 ENCOUNTER — Other Ambulatory Visit: Payer: Self-pay | Admitting: Family Medicine

## 2013-07-05 ENCOUNTER — Other Ambulatory Visit: Payer: Self-pay

## 2013-07-05 DIAGNOSIS — Z1231 Encounter for screening mammogram for malignant neoplasm of breast: Secondary | ICD-10-CM

## 2013-07-12 ENCOUNTER — Ambulatory Visit
Admission: RE | Admit: 2013-07-12 | Discharge: 2013-07-12 | Disposition: A | Discharge status: Home / Self Care | Payer: 59 | Source: Ambulatory Visit

## 2013-07-12 DIAGNOSIS — Z1231 Encounter for screening mammogram for malignant neoplasm of breast: Secondary | ICD-10-CM

## 2013-07-15 ENCOUNTER — Other Ambulatory Visit: Payer: Self-pay | Admitting: Obstetrics and Gynecology

## 2013-07-15 DIAGNOSIS — R928 Other abnormal and inconclusive findings on diagnostic imaging of breast: Secondary | ICD-10-CM

## 2013-07-30 ENCOUNTER — Ambulatory Visit
Admission: RE | Admit: 2013-07-30 | Discharge: 2013-07-30 | Disposition: A | Discharge status: Home / Self Care | Payer: 59 | Source: Ambulatory Visit | Attending: Obstetrics and Gynecology | Admitting: Obstetrics and Gynecology

## 2013-07-30 DIAGNOSIS — R928 Other abnormal and inconclusive findings on diagnostic imaging of breast: Secondary | ICD-10-CM

## 2013-07-31 ENCOUNTER — Other Ambulatory Visit: Payer: Self-pay | Admitting: Family Medicine

## 2013-08-03 NOTE — Telephone Encounter (Signed)
Levothyroxine refill routed to pharmacy w/ message that pt needs to sch OV.

## 2013-09-20 ENCOUNTER — Telehealth: Payer: Self-pay

## 2013-09-20 NOTE — Telephone Encounter (Signed)
Pt is requesting a refill on levothyroxine for 90 days. She is currently unemployed, but has insurance through December, wants to get refilled before insurance expires

## 2013-09-20 NOTE — Telephone Encounter (Signed)
Please advise pended 

## 2013-09-21 ENCOUNTER — Telehealth: Payer: Self-pay | Admitting: Radiology

## 2013-09-21 ENCOUNTER — Emergency Department (HOSPITAL_COMMUNITY): Payer: 59

## 2013-09-21 ENCOUNTER — Ambulatory Visit (INDEPENDENT_AMBULATORY_CARE_PROVIDER_SITE_OTHER): Payer: 59 | Admitting: Family Medicine

## 2013-09-21 ENCOUNTER — Encounter (HOSPITAL_COMMUNITY): Payer: Self-pay | Admitting: Emergency Medicine

## 2013-09-21 ENCOUNTER — Ambulatory Visit (INDEPENDENT_AMBULATORY_CARE_PROVIDER_SITE_OTHER): Payer: 59 | Admitting: Internal Medicine

## 2013-09-21 ENCOUNTER — Emergency Department (HOSPITAL_COMMUNITY)
Admission: EM | Admit: 2013-09-21 | Discharge: 2013-09-22 | Disposition: A | Discharge status: Home Health | Payer: 59 | Attending: Emergency Medicine | Admitting: Emergency Medicine

## 2013-09-21 VITALS — BP 118/70 | HR 99 | Temp 98.2°F | Resp 16 | Ht 66.5 in | Wt 181.4 lb

## 2013-09-21 VITALS — BP 110/58 | HR 71 | Temp 98.4°F | Resp 16

## 2013-09-21 DIAGNOSIS — S0093XA Contusion of unspecified part of head, initial encounter: Secondary | ICD-10-CM

## 2013-09-21 DIAGNOSIS — S0003XA Contusion of scalp, initial encounter: Secondary | ICD-10-CM | POA: Insufficient documentation

## 2013-09-21 DIAGNOSIS — M25511 Pain in right shoulder: Secondary | ICD-10-CM

## 2013-09-21 DIAGNOSIS — Z87891 Personal history of nicotine dependence: Secondary | ICD-10-CM | POA: Insufficient documentation

## 2013-09-21 DIAGNOSIS — S060X9A Concussion with loss of consciousness of unspecified duration, initial encounter: Secondary | ICD-10-CM

## 2013-09-21 DIAGNOSIS — Y92009 Unspecified place in unspecified non-institutional (private) residence as the place of occurrence of the external cause: Secondary | ICD-10-CM | POA: Insufficient documentation

## 2013-09-21 DIAGNOSIS — E079 Disorder of thyroid, unspecified: Secondary | ICD-10-CM | POA: Insufficient documentation

## 2013-09-21 DIAGNOSIS — Z3202 Encounter for pregnancy test, result negative: Secondary | ICD-10-CM | POA: Insufficient documentation

## 2013-09-21 DIAGNOSIS — Z91040 Latex allergy status: Secondary | ICD-10-CM | POA: Insufficient documentation

## 2013-09-21 DIAGNOSIS — S46909A Unspecified injury of unspecified muscle, fascia and tendon at shoulder and upper arm level, unspecified arm, initial encounter: Secondary | ICD-10-CM | POA: Insufficient documentation

## 2013-09-21 DIAGNOSIS — R112 Nausea with vomiting, unspecified: Secondary | ICD-10-CM

## 2013-09-21 DIAGNOSIS — S4980XA Other specified injuries of shoulder and upper arm, unspecified arm, initial encounter: Secondary | ICD-10-CM | POA: Insufficient documentation

## 2013-09-21 DIAGNOSIS — Y9389 Activity, other specified: Secondary | ICD-10-CM | POA: Insufficient documentation

## 2013-09-21 DIAGNOSIS — W07XXXA Fall from chair, initial encounter: Secondary | ICD-10-CM | POA: Insufficient documentation

## 2013-09-21 DIAGNOSIS — J209 Acute bronchitis, unspecified: Secondary | ICD-10-CM

## 2013-09-21 DIAGNOSIS — M25519 Pain in unspecified shoulder: Secondary | ICD-10-CM

## 2013-09-21 DIAGNOSIS — R55 Syncope and collapse: Secondary | ICD-10-CM | POA: Insufficient documentation

## 2013-09-21 DIAGNOSIS — R42 Dizziness and giddiness: Secondary | ICD-10-CM

## 2013-09-21 DIAGNOSIS — S06379A Contusion, laceration, and hemorrhage of cerebellum with loss of consciousness of unspecified duration, initial encounter: Secondary | ICD-10-CM

## 2013-09-21 DIAGNOSIS — Z79899 Other long term (current) drug therapy: Secondary | ICD-10-CM | POA: Insufficient documentation

## 2013-09-21 LAB — CBC WITH DIFFERENTIAL/PLATELET
Basophils Absolute: 0 10*3/uL (ref 0.0–0.1)
Basophils Relative: 0 % (ref 0–1)
Eosinophils Absolute: 0 10*3/uL (ref 0.0–0.7)
Eosinophils Relative: 0 % (ref 0–5)
Hemoglobin: 13.6 g/dL (ref 12.0–15.0)
Lymphs Abs: 1 10*3/uL (ref 0.7–4.0)
MCH: 32.9 pg (ref 26.0–34.0)
MCV: 95.7 fL (ref 78.0–100.0)
Monocytes Relative: 3 % (ref 3–12)
Neutrophils Relative %: 90 % — ABNORMAL HIGH (ref 43–77)
Platelets: 230 10*3/uL (ref 150–400)
RBC: 4.14 MIL/uL (ref 3.87–5.11)
RDW: 12.8 % (ref 11.5–15.5)

## 2013-09-21 LAB — BASIC METABOLIC PANEL
CO2: 19 mEq/L (ref 19–32)
Calcium: 7.8 mg/dL — ABNORMAL LOW (ref 8.4–10.5)
Creatinine, Ser: 0.64 mg/dL (ref 0.50–1.10)
GFR calc non Af Amer: >90 mL/min (ref >90)
Glucose, Bld: 112 mg/dL — ABNORMAL HIGH (ref 70–99)
Sodium: 136 mEq/L (ref 135–145)

## 2013-09-21 MED ORDER — HYDROCODONE-HOMATROPINE 5-1.5 MG/5ML PO SYRP: 5.0000 mL | ORAL_SOLUTION | Freq: Four times a day (QID) | ORAL | Status: DC | PRN

## 2013-09-21 MED ORDER — LEVOTHYROXINE SODIUM 125 MCG PO TABS: ORAL_TABLET | ORAL | Status: DC

## 2013-09-21 MED ORDER — ONDANSETRON 4 MG PO TBDP
4.0000 mg | ORAL_TABLET | Freq: Once | ORAL | Status: AC
  Administered 2013-09-21: 4 mg via ORAL

## 2013-09-21 MED ORDER — AZITHROMYCIN 250 MG PO TABS: ORAL_TABLET | ORAL | Status: DC

## 2013-09-21 NOTE — ED Notes (Signed)
Pt returned form xray. Phleb at bedside.

## 2013-09-21 NOTE — ED Provider Notes (Signed)
I saw and evaluated the patient, reviewed the resident's note and I agree with the findings and plan.  EKG Interpretation    Date/Time:  Tuesday September 21 2013 23:22:43 EST Ventricular Rate:  75 PR Interval:  121 QRS Duration: 92 QT Interval:  408 QTC Calculation: 456 R Axis:   78 Text Interpretation:  Sinus rhythm No old tracing to compare Confirmed by [facility]  MD, TREY ([id number]) on 09/21/2013 11:45:25 PM            42 year old female who presents after a syncopal episode She thinks her symptoms are secondary to cough syrup with codeine, which she took her first dose of earlier today. She also took azithromycin for presumed bronchitis.  I advised her to discontinue this medication and her cough syrup. On exam, no distress, alert and oriented, well-appearing, normal respirations, normal perfusion. Lab tests unremarkable. Imaging obtained to evaluate for potential injuries and are negative. Her syncope does not sound consistent with cardiogenic syncope.  Clinical Impression: 1. Syncope   2. Shoulder pain, right   3. Scalp hematoma, initial encounter       Candyce Churn, MD 09/22/13 [phone]

## 2013-09-21 NOTE — ED Provider Notes (Signed)
CSN: [id number]     Arrival date & time 09/21/13  2008 History   First MD Initiated Contact with Patient 09/21/13 2055     Chief Complaint  Patient presents with  . Loss of Consciousness   (Consider location/radiation/quality/duration/timing/severity/associated sxs/prior Treatment) HPI Renee Mendez is a 42 y.o. female who presents to the emergency department for concern of a syncopal episode.  Patient reports that she has been having a lot of cough, congestion, subjective fevers, and chills.  Started three days ago.  Saw urgent care earlier today who diagnosed her with bronchitis.  Started on Azithromycin today for which she took one dose.  Then she woke up from a nap and was walking to her kitchen when she started feeling lightheaded.  No CP/SOB/palpitations.  Sat down at table and abruptly passed out.  Remembers waking up on the floor.  Patient with immediate onset pain in R shoulder and back of head when she awoke.  Sat on floor for some time before being assisted up.  No further lightheadedness or LOC.  Came in here for further evaluation.  Past Medical History  Diagnosis Date  . Thyroid disease   . Allergy    Past Surgical History  Procedure Laterality Date  . Eardrum surgery      x3, left   Family History  Problem Relation Age of Onset  . Hypothyroidism Maternal Grandmother   . Graves' disease Mother   . Colon polyps Mother   . Breast cancer Paternal Aunt   . Brain cancer Paternal Grandmother     Tumor  . Colon cancer Neg Hx   . Esophageal cancer Neg Hx   . Stomach cancer Neg Hx   . Rectal cancer Neg Hx    History  Substance Use Topics  . Smoking status: Former Games developer  . Smokeless tobacco: Never Used  . Alcohol Use: Yes     Comment: occasionally   OB History   Grav Para Term Preterm Abortions TAB SAB Ect Mult Living                 Review of Systems  Constitutional: Negative for fever and chills.  HENT: Negative for congestion and rhinorrhea.    Respiratory: Negative for cough and shortness of breath.   Cardiovascular: Negative for chest pain.  Gastrointestinal: Negative for nausea, vomiting, abdominal pain, diarrhea and abdominal distention.  Endocrine: Negative for polyuria.  Genitourinary: Negative for dysuria.  Musculoskeletal: Negative for neck pain and neck stiffness.  Skin: Negative for rash.  Neurological: Positive for syncope and headaches.  Psychiatric/Behavioral: Negative.     Allergies  Latex and Tapazole  Home Medications   Current Outpatient Rx  Name  Route  Sig  Dispense  Refill  . azithromycin (ZITHROMAX) 250 MG tablet   Oral   Take 250 mg by mouth 2 (two) times daily. As packaged. Started medication 09-21-13         . citalopram (CELEXA) 10 MG tablet   Oral   Take 10 mg by mouth daily.         . fluticasone (FLONASE) 50 MCG/ACT nasal spray   Nasal   Place 2 sprays into the nose daily as needed for rhinitis.         Marland Kitchen HYDROcodone-homatropine (HYCODAN) 5-1.5 MG/5ML syrup   Oral   Take 5 mLs by mouth every 6 (six) hours as needed for cough.   120 mL   0   . levothyroxine (SYNTHROID, LEVOTHROID) 125 MCG tablet  Take 1 TABLET by mouth daily before breakfast   90 tablet   0     Pt needs to schedule office visit for labs.   . loratadine (CLARITIN) 10 MG tablet   Oral   Take 10 mg by mouth daily.         . Norethindrone-Eth Estradiol (NORTREL 1/35, 21, PO)   Oral   Take by mouth daily.          BP 101/60  Pulse 80  Temp(Src) 99.2 F (37.3 C) (Oral)  Resp 14  SpO2 96%  LMP 08/31/2013 Physical Exam  Nursing note and vitals reviewed. Constitutional: She is oriented to person, place, and time. She appears well-developed and well-nourished. No distress.  HENT:  Head: Normocephalic.  Right Ear: External ear normal.  Left Ear: External ear normal.  Nose: Nose normal.  Mouth/Throat: Oropharynx is clear and moist. No oropharyngeal exudate.  Hematoma on occiput of head.  No  open wounds.  Eyes: EOM are normal. Pupils are equal, round, and reactive to light.  Neck: Normal range of motion. Neck supple. No tracheal deviation present.  Cardiovascular: Normal rate.   Pulmonary/Chest: Effort normal and breath sounds normal. No stridor. No respiratory distress. She has no wheezes. She has no rales.  Abdominal: Soft. She exhibits no distension. There is no tenderness. There is no rebound.  Musculoskeletal: Normal range of motion.       Cervical back: She exhibits no bony tenderness.       Thoracic back: She exhibits no bony tenderness.       Lumbar back: She exhibits no bony tenderness.  R shoulder.  TTP over posterior scapula.  No deformity.  Painful ROM.  Neurovascularly intact distally. All other joints and extremities evaluated and free of deformity or painful ROM.  2+ pulse in all extremities.   Neurological: She is alert and oriented to person, place, and time.  Skin: Skin is warm and dry. She is not diaphoretic.    ED Course  Procedures (including critical care time) Labs Review Labs Reviewed  BASIC METABOLIC PANEL - Abnormal; Notable for the following:    Glucose, Bld 112 (*)    Calcium 7.8 (*)    All other components within normal limits  CBC WITH DIFFERENTIAL - Abnormal; Notable for the following:    WBC 13.3 (*)    Neutrophils Relative % 90 (*)    Neutro Abs 11.9 (*)    Lymphocytes Relative 8 (*)    All other components within normal limits  POCT PREGNANCY, URINE   Imaging Review Dg Chest 2 View  09/21/2013   CLINICAL DATA:  Syncope with fall. Right shoulder pain with limited range of motion.  EXAM: CHEST  2 VIEW  COMPARISON:  None.  FINDINGS: Midline trachea. Normal heart size and mediastinal contours. No pleural effusion or pneumothorax. Clear lungs.  IMPRESSION: No acute cardiopulmonary disease.   Electronically Signed   By: Jeronimo Greaves M.D.   On: 09/21/2013 22:47   Dg Shoulder Right  09/21/2013   CLINICAL DATA:  Right shoulder pain, limited  range of motion  EXAM: RIGHT SHOULDER - 2+ VIEW  COMPARISON:  None.  FINDINGS: No displaced fracture or dislocation. No aggressive osseous lesion or overt degenerative change. Visualized portion of the right lung is grossly clear.  IMPRESSION: No acute osseous abnormality right shoulder.   Electronically Signed   By: Jearld Lesch M.D.   On: 09/21/2013 22:36   Ct Head Wo Contrast  09/21/2013  CLINICAL DATA:  Fall with loss of consciousness.  EXAM: CT HEAD WITHOUT CONTRAST  TECHNIQUE: Contiguous axial images were obtained from the base of the skull through the vertex without intravenous contrast.  COMPARISON:  None.  FINDINGS: Sinuses/Soft tissues: Right parietal scalp soft tissue swelling. Mucosal thickening in the right maxillary sinus. Clear mastoid air cells. No calvarial fracture.  Intracranial: No mass lesion, hemorrhage, hydrocephalus, acute infarct, intra-axial, or extra-axial fluid collection.  IMPRESSION: Right parietal scalp soft tissue swelling, without acute intracranial abnormality.  Mild sinus disease.   Electronically Signed   By: Jeronimo Greaves M.D.   On: 09/21/2013 22:54    EKG Interpretation    Date/Time:  Tuesday September 21 2013 23:22:43 EST Ventricular Rate:  75 PR Interval:  121 QRS Duration: 92 QT Interval:  408 QTC Calculation: 456 R Axis:   78 Text Interpretation:  Sinus rhythm No old tracing to compare Confirmed by [facility]  MD, TREY ([id number]) on 09/21/2013 11:45:25 PM            MDM   1. Syncope   2. Shoulder pain, right   3. Scalp hematoma, initial encounter    Renee Mendez is a 42 y.o. female who presented to the ED after an episode of syncope in setting of URI.  [place] Syncope Rule negative.  Patient likely mildly dehydrated and also had cough medicine with codeine earlier today.  Both of these likely contributed to episode.  Exam notable for mild hematoma of head and shoulder pain.  No signs of trauma to other extremities, chest, abdomen,  pelvis, or spine.  CT head and shoulder XR negative.  Patient rehydrated and feeling better.  EKG without evidence of arrythmia.  Recommend sling for comfort of shoulder.  Recommend stopping Azithromycin as this may have contributed to her syncope.  Patient safe for discharge home.  Patient discharged.    Arloa Koh, MD 09/22/13 [phone]

## 2013-09-21 NOTE — Telephone Encounter (Signed)
Levothyroxine refill signed; pt does need appt. Last seen by me in Jan 2014.

## 2013-09-21 NOTE — Telephone Encounter (Signed)
Patient states she has had shaking and felt terrible /dizzy after taking the meds. She has vomited twice also. Advised her not to take any more of the meds. She indicates she blacked out and hit her shoulder, she is coming back in now, for this. She did injure her shoulder when she fell. She will not drive she has called a friend to drive her in.

## 2013-09-21 NOTE — Progress Notes (Signed)
   Subjective:    Patient ID: Renee Mendez, female    DOB: [birth year], 42 y.o.   MRN: [id number]  HPIcough 1 week-getting worse/occas sputum No nasal congestion Can't sleep due to cough    Review of Systems non    Objective:   Physical Exam BP 118/70  Pulse 99  Temp(Src) 98.2 F (36.8 C) (Oral)  Resp 16  Ht 5' 6.5" (1.689 m)  Wt 181 lb 6.4 oz (82.283 kg)  BMI 28.84 kg/m2  SpO2 98%  LMP 08/31/2013 Tm cl Nares cl thr cl No nodes Chest with rhonchi bilat        Assessment & Plan:  Acute bronchitis  Meds ordered this encounter  Medications  . azithromycin (ZITHROMAX) 250 MG tablet    Sig: As packaged    Dispense:  6 tablet    Refill:  0  . HYDROcodone-homatropine (HYCODAN) 5-1.5 MG/5ML syrup    Sig: Take 5 mLs by mouth every 6 (six) hours as needed for cough.    Dispense:  120 mL    Refill:  0

## 2013-09-21 NOTE — ED Notes (Signed)
Patient transported to X-ray 

## 2013-09-21 NOTE — Progress Notes (Signed)
Subjective:    Patient ID: Renee Mendez, female    DOB: [birth year], 42 y.o.   MRN: [id number]  This chart was scribed for Meredith Staggers, MD by Greggory Stallion, Medical Scribe. This patient's care was started at 6:44 PM.  HPI HPI Comments: Renee Mendez is a 42 y.o. female who presents to the office complaining of a fall that occurred earlier today. Pt was here this morning and saw Dr. Merla Riches and was diagnosed with bronchitis. She was given a Z-pack and hycodan. She took both medications around 10:30 AM, layed down for nap and when awoke she got chills and diaphoresis.  She felt everything go gray and had an un witnessed syncopal episode. Pt woke up on the floor but doesn't remember what happened before that. She was in and out of consciousness for about one hour. With blurry vision once she woke up from the episode, but denies it currently.  Pt had sudden onset right shoulder pain and a knot on the back of her head.  Pt is currently nauseous and has had 3 episodes of emesis since the episode. She also feels light headed and dizzy right now, with few episodes of this without repeat syncope since hitting head/LOC.  Denies neck pain, headache, numbness, tingling, weakness in extremities, chest pain, difficulty breathing. Denies history of seizures or head injury.   Patient Active Problem List   Diagnosis Date Noted  . Hypothyroidism 02/18/2012  . Overweight (BMI 25.0-29.9) 02/18/2012  . Constipation - functional 02/18/2012  . Health care maintenance 02/18/2012  . Hx of tinnitus 02/18/2012  . Chronic rhinitis 02/18/2012   Past Medical History  Diagnosis Date  . Thyroid disease   . Allergy    Past Surgical History  Procedure Laterality Date  . Eardrum surgery      x3, left   Allergies  Allergen Reactions  . Tapazole [Methimazole]     Severe joint pain   Prior to Admission medications   Medication Sig Start Date End Date Taking? Authorizing Provider  azithromycin (ZITHROMAX)  250 MG tablet As packaged 09/21/13  Yes Tonye Pearson, MD  citalopram (CELEXA) 10 MG tablet Take 10 mg by mouth daily.   Yes Historical Provider, MD  fluticasone (FLONASE) 50 MCG/ACT nasal spray Place 2 sprays into the nose daily. 11/13/12  Yes Maurice March, MD  HYDROcodone-homatropine [facility]) 5-1.5 MG/5ML syrup Take 5 mLs by mouth every 6 (six) hours as needed for cough. 09/21/13  Yes Tonye Pearson, MD  levothyroxine (SYNTHROID, LEVOTHROID) 125 MCG tablet Take 1 TABLET by mouth daily before breakfast 09/20/13  Yes Maurice March, MD  Linaclotide [facility]) 290 MCG CAPS Take 290 mcg by mouth daily. 05/20/12  Yes Beverley Fiedler, MD  loratadine (CLARITIN) 10 MG tablet Take 10 mg by mouth daily.   Yes Historical Provider, MD  Norethindrone-Eth Estradiol (NORTREL 1/35, 21, PO) Take by mouth daily.   Yes Historical Provider, MD   History   Social History  . Marital Status: Single    Spouse Name: N/A    Number of Children: 1  . Years of Education: N/A   Occupational History  . Receptionist    Social History Main Topics  . Smoking status: Former Games developer  . Smokeless tobacco: Never Used  . Alcohol Use: Yes     Comment: occasionally  . Drug Use: No  . Sexual Activity: Not on file   Other Topics Concern  . Not on file   Social History Narrative  .  No narrative on file    Review of Systems  Eyes: Negative for visual disturbance.  Cardiovascular: Negative for chest pain.  Gastrointestinal: Positive for nausea and vomiting.  Musculoskeletal: Positive for arthralgias. Negative for neck pain.  Neurological: Positive for dizziness, syncope and light-headedness. Negative for weakness, numbness and headaches.       Objective:   Physical Exam  Vitals reviewed. Constitutional: She is oriented to person, place, and time. She appears well-developed and well-nourished. No distress.  HENT:  Head: Normocephalic and atraumatic.  Soft tissue swelling, tender to palpation on  right occiput without wound. Skin is intact.   Eyes: EOM are normal.  Neck: Neck supple.  Cardiovascular: Normal rate.   Pulmonary/Chest: Effort normal. No respiratory distress.  Musculoskeletal:  Wrist is non tender on the right. Elbow is non tender. Crane non tender. Tenderness of distal clavicle to [facility] joint. Humerus non tender. T-spine and LS spine are non tender. Right shoulder is guarded with 15 degrees of motion restricted to pain.   Neurological: She is alert and oriented to person, place, and time.  Equal facial movements bilaterally. Equal grip strength bilaterally. Neurovascularly intact.  Skin: Skin is warm and dry.  Psychiatric: She has a normal mood and affect. Her behavior is normal.   Filed Vitals:   09/21/13 1752  BP: 112/78  Pulse: 78  Temp: 98.4 F (36.9 C)  TempSrc: Oral  Resp: 16  SpO2: 99%    7:25 PM-Initially had chosen to go by private vehicle but now going by EMS transport for dizziness and head injury. EMS called for transport. IV placed.  7:44 PM-Report given to EMS. Press photographer at [employer] advised. Transfer of care.   Assessment & Plan:   Renee Mendez is a 42 y.o. female Nausea and vomiting - Plan: ondansetron (ZOFRAN-ODT) disintegrating tablet 4 mg  Dizziness  Head injury, closed, with LOC of unknown duration, initial encounter  Contusion of head, initial encounter  Pain in joint, shoulder region, right  Initial diagnosis of bronchitis earlier today - started on Zpak and hycodan cough syrup, with fall/head injury as above, unwitnessed, but reported LOC with 1 hour fluctuating LOC vs sleeping from hycodan. Repeated episodes of vomiting and dizziness/near syncope since - will have evaluated at ER for CHI with LOC - see above. Charge nurse advised, sent by EMS.   R shoulder pain with pain over distal clavicle/AC - possible distal 1/3rd clavicle fx vs AC sprain - shoulder sling placed until eval at [facility] for possible xrays.     Meds ordered this  encounter  Medications  . ondansetron (ZOFRAN-ODT) disintegrating tablet 4 mg    Sig:    Patient Instructions  We will have you evaluated at the emergency room  for your vomiting, dizziness, and head injury from earlier today with suspected loss of consciousness. You may have a collarbone injury, but can have xrays done at the emergency room. Stop hydrocodone cough syrup at this point, and discuss other options at emergency room evaluation.         I personally performed the services described in this documentation, which was scribed in my presence. The recorded information has been reviewed and considered, and addended by me as needed.

## 2013-09-21 NOTE — Patient Instructions (Addendum)
We will have you evaluated at the emergency room  for your vomiting, dizziness, and head injury from earlier today with suspected loss of consciousness. You may have a collarbone injury, but can have xrays done at the emergency room. Stop hydrocodone cough syrup at this point, and discuss other options at emergency room evaluation.

## 2013-09-21 NOTE — ED Notes (Signed)
Pt given drink per EDP's V.O. And told of need for urine sample.

## 2013-09-21 NOTE — ED Notes (Addendum)
Pt sent from urgent care with multiple syncopal episodes with LOC. She went in the morning to UC and was dx with bronchitis; took cough syrup with codeine; fell out of chair; LOC. Then patient continued to have multiple episodes of syncope with confirmed LOC by family; few episodes of vomiting. VSS. CBG normal. R shoulder in sling and hematoma on back of head.

## 2013-09-22 LAB — POCT PREGNANCY, URINE: Preg Test, Ur: NEGATIVE

## 2013-09-22 NOTE — ED Provider Notes (Signed)
I saw and evaluated the patient, reviewed the resident's note and I agree with the findings and plan.  EKG Interpretation    Date/Time:  Tuesday September 21 2013 23:22:43 EST Ventricular Rate:  75 PR Interval:  121 QRS Duration: 92 QT Interval:  408 QTC Calculation: 456 R Axis:   78 Text Interpretation:  Sinus rhythm No old tracing to compare Confirmed by [facility]  MD, TREY [phone]) on 09/21/2013 11:45:25 PM              Candyce Churn, MD 09/22/13 [phone]

## 2013-09-23 ENCOUNTER — Telehealth: Payer: Self-pay

## 2013-09-23 NOTE — Telephone Encounter (Signed)
Patient is calling to see if we can set her up for [facility] mail [facility] if possible dr Audria Nine is her doctor please call her at [phone]

## 2013-09-23 NOTE — Telephone Encounter (Signed)
Called and spoke to patient in regards to wanting to set up [facility] . She is losing her insurance at the end of the month and was thinking that getting this set up for 90 days would hold her over until her new insurance starts. Pt. Is wanting this for levothyroxine med. Looks like last TSH was in Jan. Wanted to make sure if she needed to be seen to have this checked prior to having all this occur.

## 2013-09-23 NOTE — Telephone Encounter (Signed)
Pt will have to be seen this month for thyroid follow-up and labs since I have not seen her since Jan 2014. Then mail order prescription can be set up with correct dose of medication.

## 2013-09-24 NOTE — Telephone Encounter (Signed)
Called pt she is making an appt to come in this month before she does not have insurance.

## 2013-09-28 ENCOUNTER — Ambulatory Visit (INDEPENDENT_AMBULATORY_CARE_PROVIDER_SITE_OTHER): Payer: 59 | Admitting: Family Medicine

## 2013-09-28 ENCOUNTER — Encounter: Payer: Self-pay | Admitting: Family Medicine

## 2013-09-28 VITALS — BP 130/88 | HR 82 | Temp 99.1°F | Resp 16 | Ht 68.0 in | Wt 180.0 lb

## 2013-09-28 DIAGNOSIS — M25519 Pain in unspecified shoulder: Secondary | ICD-10-CM

## 2013-09-28 DIAGNOSIS — M25511 Pain in right shoulder: Secondary | ICD-10-CM

## 2013-09-28 DIAGNOSIS — S060X9S Concussion with loss of consciousness of unspecified duration, sequela: Secondary | ICD-10-CM

## 2013-09-28 DIAGNOSIS — S069X9S Unspecified intracranial injury with loss of consciousness of unspecified duration, sequela: Secondary | ICD-10-CM

## 2013-09-28 DIAGNOSIS — E039 Hypothyroidism, unspecified: Secondary | ICD-10-CM

## 2013-09-28 MED ORDER — TRAMADOL HCL 50 MG PO TABS: 50.0000 mg | ORAL_TABLET | Freq: Three times a day (TID) | ORAL | Status: DC | PRN

## 2013-09-28 MED ORDER — FLUTICASONE PROPIONATE 50 MCG/ACT NA SUSP: 2.0000 | Freq: Every day | NASAL | Status: DC | PRN

## 2013-09-28 NOTE — Patient Instructions (Signed)
Concussion and Brain Injury A blow to the head can stop the brain from working normally (concussion). It is usually not life-threatening. However, the results of the injury can be serious. Problems caused by the injury might show up right away or days or weeks later. Getting better might take some time. HOME CARE  Rest your body. Ways to rest your body include:  Getting plenty of sleep at night.  Going to sleep early.  Taking naps during the day when you feel tired.  Limit activities that require a lot of thought. This includes:  Time spent with homework.  Time spent with work related to a job.  TV watching.  Computer use.  Return to normal activities (driving, work, school) only when your doctor says it is okay.  Avoid high impact activity and sports until your doctor says it is okay.  Take medicines only as told by your doctor.  Do not drink alcohol until your doctor says it is okay.  Do not make important decisions without help until you feel better.  Follow up with your doctor as told. GET HELP RIGHT AWAY IF:  You, your family, or your friends notice that:  You have bad headaches, or they get worse.  You have weakness, loss of feeling (numbness), or you feel off balance.  You keep throwing up (vomiting).  You feel tired or pass out (faint).  One black center of your eye (pupil) is larger than the other.  You twitch or shake (seize).  Your speech is not clear (slurred).  You are confused, restless, easily angered (agitated), or annoyed (irritable).  You cannot recognize or respond to people or activities.  You have neck pain.  You have trouble being woken up.  Your behavior changes. MAKE SURE YOU:   Understand these instructions.  Will watch your condition.  Will get help right away if you are not doing well or get worse. Document Released: 09/25/2009 Document Revised: 12/30/2011 Document Reviewed: 04/29/2013 [facility] Patient Information 2014  [place], [place].    For SHOULDER PAIN and Contusion: Get ARNICA GEL over-the-counter; you can apply this clear odorless gel to your sore joints and muscles. You can use it several times a day.  Do not apply it to areas of skin where there is an opening in the skin.

## 2013-09-29 ENCOUNTER — Other Ambulatory Visit: Payer: Self-pay

## 2013-09-29 ENCOUNTER — Encounter: Payer: Self-pay | Admitting: *Deleted

## 2013-09-29 ENCOUNTER — Telehealth: Payer: Self-pay

## 2013-09-29 LAB — T4, FREE: Free T4: 1.21 ng/dL (ref 0.80–1.80)

## 2013-09-29 MED ORDER — LEVOTHYROXINE SODIUM 125 MCG PO TABS: ORAL_TABLET | ORAL | Status: DC

## 2013-09-29 NOTE — Telephone Encounter (Signed)
Resent thyroid meds to the mail order  Please advise on gel. ? voltaren pended.

## 2013-09-29 NOTE — Progress Notes (Signed)
Quick Note:  Please notify pt that results are normal.   Provide pt with copy of labs. ______ 

## 2013-09-29 NOTE — Telephone Encounter (Signed)
Spoke with [facility], her Levothyroxine is there and I called in Tramadol. Called pt LMOM to lether know her Rx's should be there now.

## 2013-09-29 NOTE — Telephone Encounter (Signed)
PT STATES DR MCPHERSON FORGOT TO CALL IN THE LEVOTHYROXINE AND THE MEDICINE FOR JOINT PAIN. PLEASE CALL [phone]    [facility] [address]

## 2013-09-29 NOTE — Telephone Encounter (Signed)
Pt needs 2 things:   1) needs rx for topical gel for joint pain called into [facility] @ [address]  They did not receive this yesterday     2) needs rx for 90 supply of thyroid med (levothyroxine) [facility] ([facility]). [facility] could not give her a 90 supply per ins. They could only fill 30 supply

## 2013-09-30 MED ORDER — DICLOFENAC SODIUM 1 % TD GEL: 4.0000 g | Freq: Four times a day (QID) | TRANSDERMAL | Status: DC

## 2013-09-30 NOTE — Telephone Encounter (Signed)
Diclofenac sodium 1% gel refill phoned to [facility] @ [address].

## 2013-09-30 NOTE — Progress Notes (Signed)
S:  This 42 y.o. Cauc female is here today for thyroid medication and labs prior to getting 90-day supply before end of year and termination of current insurance plan. She c/o mild- moderate fatigue and skin/hair changes. Attempts at weight loss have been unsuccessful. Pt has been compliant w/ medication and reports no adverse effects.  Pt reports a syncopal episode requiring evaluation at [facility] ED on 09/21/13; reaction to medication is suspect since she had taken one dose of codeine cough medication as well as azithromycin. Head CT was negative. [facility] eval was unremarkable.  Unfortunately, she suffered R shoulder contusion and scalp hematoma. Subsequent n/v and dizziness resulted in eval at [facility] later on same day. Today, she has shoulder pain w/ greatly reduced ROM and mild HA w/ some memory problems. She is having some sleep difficulties associated w/ this.  Patient Active Problem List   Diagnosis Date Noted  . Hypothyroidism 02/18/2012  . Overweight (BMI 25.0-29.9) 02/18/2012  . Constipation - functional 02/18/2012  . Health care maintenance 02/18/2012  . Hx of tinnitus 02/18/2012  . Chronic rhinitis 02/18/2012   PMHx, Soc and Fam Hx reviewed.  ROS: AS per HPI.  O: Filed Vitals:   09/28/13 1608  BP: 130/88  Pulse: 82  Temp: 99.1 F (37.3 C)  Resp: 16   GEN: In NAD: WN,WD. HENT: [place]; posterior scalp tender w/ swelling c/w hematoma. EOMI w/ clear conj/sclerae. Otherwise unremarkable. NECK: Decreased ROM/ stiff w/ rotation.  MS: MAEs; shoulder muscles w/ spasms, tender to palpation. R arm- in sling. SKIN: W&D; intact w/o diaphoresis, erythema or pallor. NEURO: A&O x 3; CNs intact. Nonfocal.  A/P: Unspecified hypothyroidism -  Will authorize refill on medication after labs resulted. Plan: T4, Free, T3, Free, TSH  Concussion with loss of consciousness, sequela- Monitor; fortunately, pt is not working. Anticipatory guidance.  Pain in joint, shoulder region, right- Pt advised to  return if symptoms worsen; she will be uninsured as of Oct 21, 2013. She will act accordingly.

## 2013-10-18 ENCOUNTER — Telehealth: Payer: Self-pay

## 2013-10-18 NOTE — Telephone Encounter (Signed)
Patient would like to discuss disability paperwork with Dr. Neva Seat. It was not discussed during her visit but she would like to know how much time she will be out, etc.  Best [phone]

## 2013-10-19 ENCOUNTER — Ambulatory Visit: Payer: 59 | Admitting: Family Medicine

## 2013-10-19 DIAGNOSIS — Z0271 Encounter for disability determination: Secondary | ICD-10-CM

## 2013-10-19 NOTE — Telephone Encounter (Signed)
Unfortunately when I evaluated [name], we deferred evaluation of her shoulder until seen in ER d/t her other presenting symptoms and transport to ER. In review of the ER notes, and XR report, no fx was noted, diagnosed with shoulder contusion, and sling placed, but other duration not known.  Will need to discuss with Dr. Audria Nine as seen in follow up, but not sure if shoulder discussed much at that ov.

## 2013-10-19 NOTE — Telephone Encounter (Signed)
Patient states that after her accident (patient states that she fell in her home), she could not lift right arm for 2 weeks; patient also hit her head. Patient saw Dr. Neva Seat and was sent to the ER. Patient dropped off disability forms to be completed and sent to [facility]   [phone]

## 2013-10-19 NOTE — Telephone Encounter (Addendum)
We have not discussed work restrictions with her, how long was she out of work? She indicates she was not working she was let go from her job in November, she indicates she is filing this with [facility] she wants you to state she could not use her arm or care for herself for 2 weeks after the injury.

## 2013-10-20 ENCOUNTER — Telehealth: Payer: Self-pay

## 2013-10-20 NOTE — Telephone Encounter (Signed)
I reviewed last OV note; we briefly discussed shoulder injury and I did minimal exam. I advised pt to return if she did not improve w/in 2 weeks or if she worsened. She understood but was hesitant to schedule follow-up do to insurance coverage ending 10/20/13. I am willing to  complete [facility] form stating "disability" for 2 weeks based on last visit.

## 2013-10-20 NOTE — Telephone Encounter (Signed)
Disability form in Dr Paralee Cancel box for completion.

## 2013-10-21 DIAGNOSIS — S060XAA Concussion with loss of consciousness status unknown, initial encounter: Secondary | ICD-10-CM

## 2013-10-21 HISTORY — DX: Concussion with loss of consciousness status unknown, initial encounter: S06.0XAA

## 2013-10-21 NOTE — Telephone Encounter (Signed)
Will you please make sure forms are sent to Dr Leward Quan for her?

## 2013-10-25 ENCOUNTER — Telehealth: Payer: Self-pay

## 2013-10-25 NOTE — Telephone Encounter (Signed)
Patient calling to speak to Amy. She is returning her phone call to answer some clinical disability questions for Dr. Carlota Raspberry to fill out. She said she dropped off the forms last week and was checking status also. Please advise.   Best: [phone]

## 2013-10-25 NOTE — Telephone Encounter (Signed)
These forms were to go to Dr Leward Quan, not Dr Carlota Raspberry.

## 2013-10-27 NOTE — Telephone Encounter (Signed)
Patient is calling to see when her ppw will be finished so that she can submit these to her insurance carrier  [phone]

## 2013-10-29 NOTE — Telephone Encounter (Signed)
Done. Notified patient.

## 2013-10-29 NOTE — Telephone Encounter (Signed)
Dr. Carlota Raspberry gave paperwork to Dr. Leward Quan to complete since she saw patient last. Notified patient it is ready for pickup.

## 2013-11-04 ENCOUNTER — Ambulatory Visit (INDEPENDENT_AMBULATORY_CARE_PROVIDER_SITE_OTHER): Payer: 59 | Admitting: Family Medicine

## 2013-11-04 ENCOUNTER — Encounter: Payer: Self-pay | Admitting: Family Medicine

## 2013-11-04 VITALS — BP 124/77 | HR 79 | Temp 98.4°F | Resp 16 | Ht 66.5 in | Wt 183.0 lb

## 2013-11-04 DIAGNOSIS — M25519 Pain in unspecified shoulder: Secondary | ICD-10-CM

## 2013-11-04 NOTE — Progress Notes (Signed)
S:  This 42 y.o. Cauc female is here for recheck of R shoulder pain that occurred after a syncopal episode in Dec 2014. She is filing disability w/ [employer] and needs a form completed. The form had been previously completed but a release to work date could not be entered until pt had returned for clearance. She is currently unemployed and is awaiting unemployment benefits. She is applying and interviewing for job placement. She is fully recovered from injury to shoulder.  Patient Active Problem List   Diagnosis Date Noted  . Hypothyroidism 02/18/2012  . Overweight (BMI 25.0-29.9) 02/18/2012  . Constipation - functional 02/18/2012  . Health care maintenance 02/18/2012  . Hx of tinnitus 02/18/2012  . Chronic rhinitis 02/18/2012   PMHx, Soc and Fam Hx reviewed.  ROS: Noncontributory.  O: Filed Vitals:   11/04/13 1119  BP: 124/77  Pulse: 79  Temp: 98.4 F (36.9 C)  Resp: 16   GEN: in NAD; WN,WD. COR: RRR. LUNGS: Unlabored resp. MS: Upper extremities- FROM in shoulders w/ 4+/5 strength and normal grip.No point tenderness or muscle spasms in posterior shoulder girdle. NEURO: A&O x 3; CNs intact. Nonfocal.  A/P: Pain in joint, shoulder region- Complete resolution; [employer] form completed(scanned into EPIC).

## 2013-11-04 NOTE — Patient Instructions (Signed)
I have released you to return to work. Good luck with your job hunt.

## 2013-11-18 ENCOUNTER — Encounter: Payer: Self-pay | Admitting: Family Medicine

## 2013-11-26 ENCOUNTER — Encounter: Payer: Self-pay | Admitting: Family Medicine

## 2013-12-03 ENCOUNTER — Telehealth: Payer: Self-pay

## 2013-12-03 NOTE — Telephone Encounter (Signed)
Patient is requesting a medical release for be mailed to her at  [facility] in [place].    She does not have access to a computer.   [phone]

## 2013-12-04 NOTE — Telephone Encounter (Signed)
Letter mailed

## 2014-02-11 ENCOUNTER — Other Ambulatory Visit: Payer: Self-pay | Admitting: Obstetrics and Gynecology

## 2014-02-11 ENCOUNTER — Other Ambulatory Visit (HOSPITAL_COMMUNITY)
Admission: RE | Admit: 2014-02-11 | Discharge: 2014-02-11 | Disposition: A | Discharge status: Home / Self Care | Payer: BC Managed Care – PPO | Source: Ambulatory Visit | Attending: Obstetrics and Gynecology | Admitting: Obstetrics and Gynecology

## 2014-02-11 DIAGNOSIS — Z1151 Encounter for screening for human papillomavirus (HPV): Secondary | ICD-10-CM | POA: Insufficient documentation

## 2014-02-11 DIAGNOSIS — Z01419 Encounter for gynecological examination (general) (routine) without abnormal findings: Secondary | ICD-10-CM | POA: Insufficient documentation

## 2014-05-02 IMAGING — MG MM DIAGNOSTIC LTD LEFT
2 series · 2 of 2 positions shown · non-contrast
Comparison: With priors

CLINICAL DATA: Abnormal left screening mammogram.

DIGITAL DIAGNOSTIC LEFT MAMMOGRAM WITH CAD

[L CC]
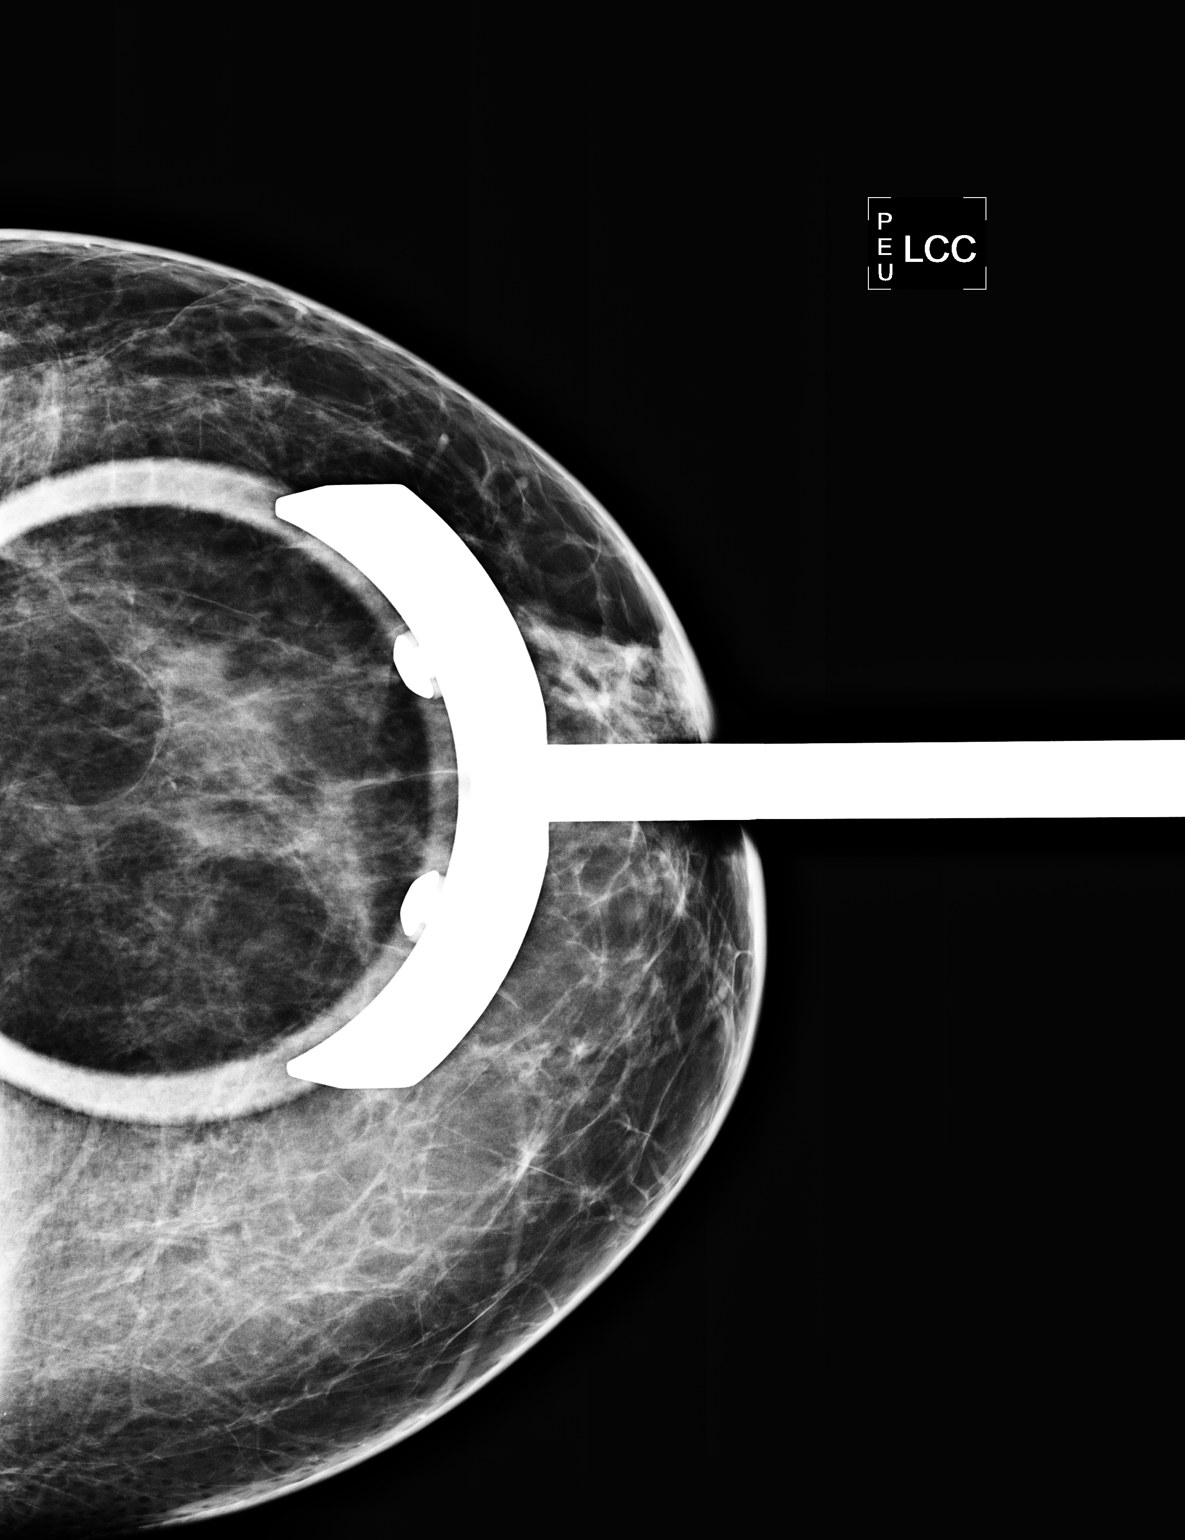

[L ML]
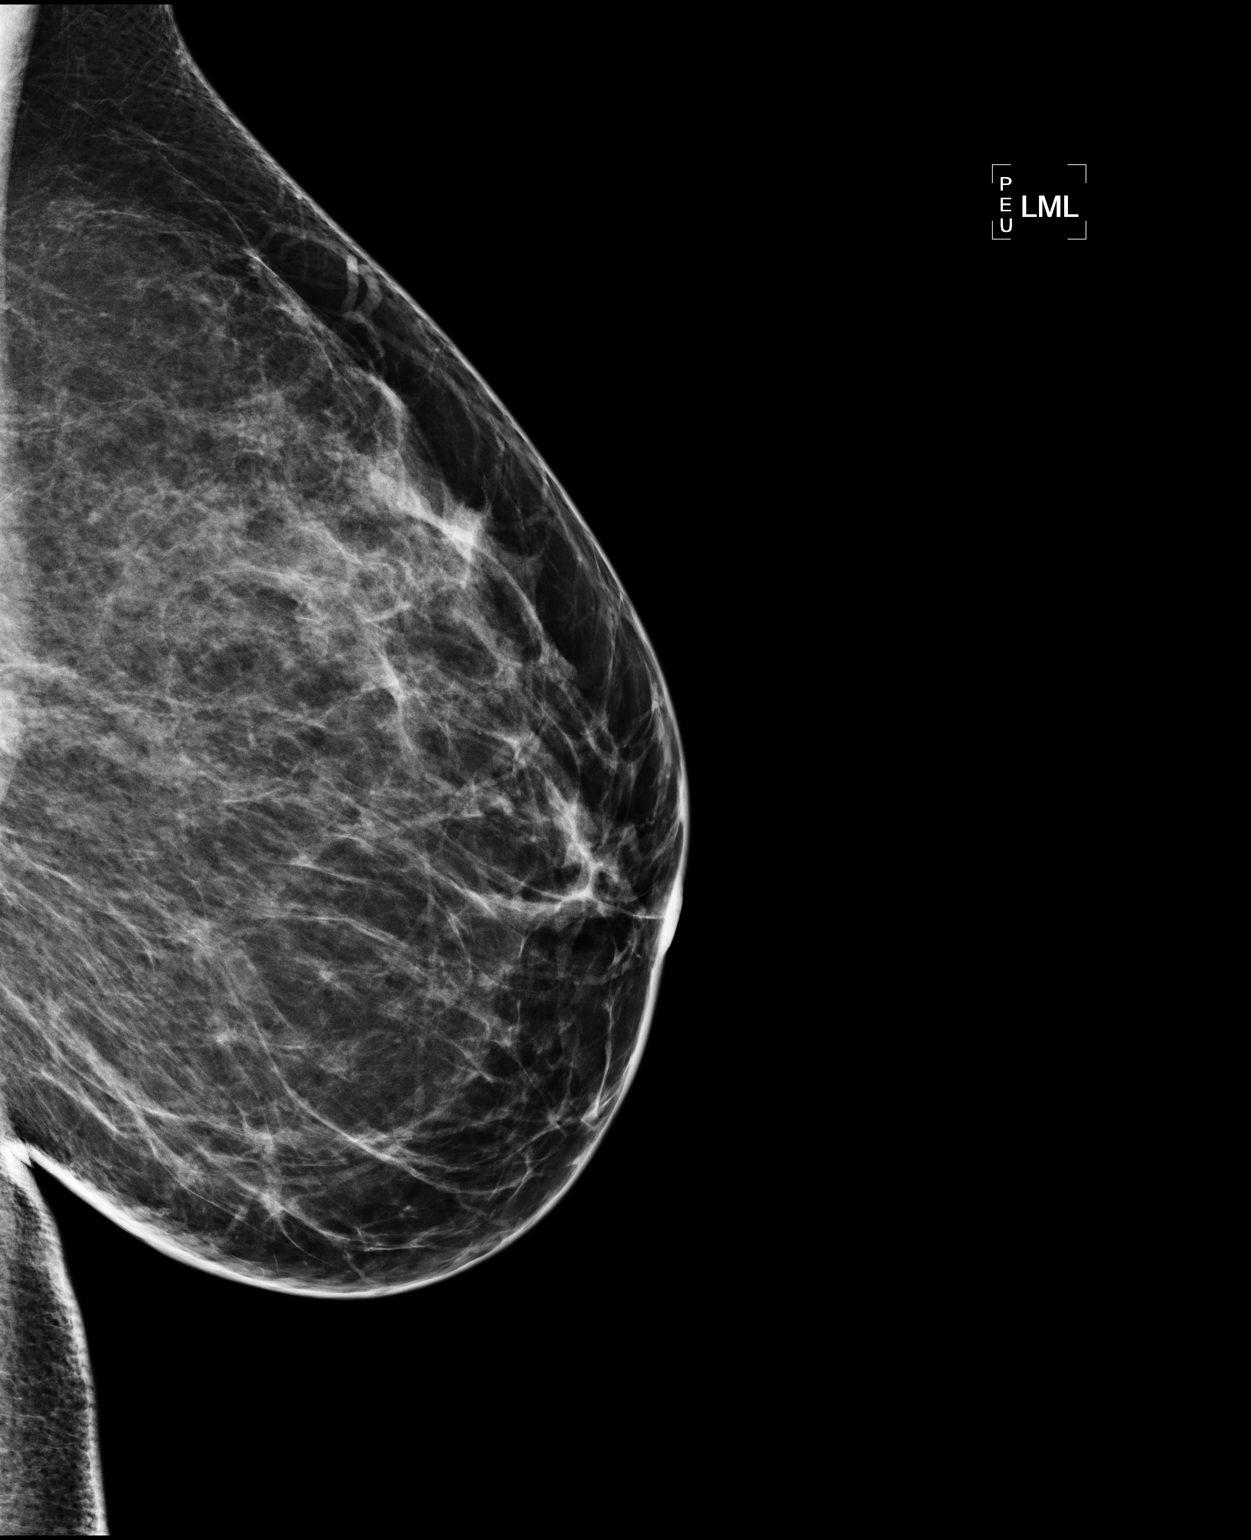

[2 of 2 positions shown; findings below may reference images not displayed]

FINDINGS: ACR Breast Density Category b:  There are scattered areas of
fibroglandular density.

True lateral and spot compression views of the left breast were
performed.  There is no suspicious mass or malignant-type
microcalcifications.

Mammographic images were processed with CAD.
IMPRESSION: No evidence of malignancy in the left breast.

RECOMMENDATION:
Bilateral screening mammogram in 1 year is recommended.

I have discussed the findings and recommendations with the patient.
Results were also provided in writing at the conclusion of the
visit.  If applicable, a reminder letter will be sent to the
patient regarding her next appointment.

BI-RADS CATEGORY 1:  Negative.

## 2014-08-12 ENCOUNTER — Encounter: Payer: Self-pay | Admitting: *Deleted

## 2014-08-12 ENCOUNTER — Encounter: Payer: Self-pay | Admitting: Neurology

## 2014-08-12 ENCOUNTER — Ambulatory Visit (INDEPENDENT_AMBULATORY_CARE_PROVIDER_SITE_OTHER): Payer: BC Managed Care – PPO | Admitting: Neurology

## 2014-08-12 VITALS — BP 130/80 | HR 80 | Ht 66.0 in | Wt 185.4 lb

## 2014-08-12 DIAGNOSIS — G43809 Other migraine, not intractable, without status migrainosus: Secondary | ICD-10-CM

## 2014-08-12 DIAGNOSIS — G43109 Migraine with aura, not intractable, without status migrainosus: Secondary | ICD-10-CM

## 2014-08-12 DIAGNOSIS — G43119 Migraine with aura, intractable, without status migrainosus: Secondary | ICD-10-CM

## 2014-08-12 DIAGNOSIS — H539 Unspecified visual disturbance: Secondary | ICD-10-CM

## 2014-08-12 MED ORDER — FLURBIPROFEN 50 MG PO TABS: 50.0000 mg | ORAL_TABLET | Freq: Two times a day (BID) | ORAL | Status: DC | PRN

## 2014-08-12 MED ORDER — PROCHLORPERAZINE MALEATE 5 MG PO TABS: ORAL_TABLET | ORAL | Status: DC

## 2014-08-12 NOTE — Progress Notes (Signed)
Note faxed.

## 2014-08-12 NOTE — Progress Notes (Signed)
[facility] Note - Initial Visit   Date: 08/12/2014  Renee Mendez MRN: [id number] DOB: 1971/06/10   Dear Dr. Shirline Mendez:  Thank you for your kind referral of Renee Mendez for consultation of headaches. Although her history is well known to you, please allow [place] to reiterate it for the purpose of our medical record. The patient was accompanied to the clinic by self.    History of Present Illness: Renee Mendez is a 43 y.o. right-handed Caucasian female with history of hypothyroidism and depression presenting for evaluation of headaches.    Starting on 10/4, she developed left hand numbness which progressed to involve the lips and started having flashes of light in the left eye.  The followed day, she again has colorful flashes of light, left peripheral vision loss involving the left eye, nausea, lightheaded, and severe knife-like right parietal pain.  She works as a Research scientist (physical sciences) at [employer] was given toradol and phenergan at work.  She slept from 10am - 5pm and when she woke up, headache improved but she felt foggy.  She had dilated eye exam on 10/7 which showed normal exam and was diagnosed with ocular migraine.  Since symptoms started, she has three spells of vision changes with associated right sided headache.  Symptoms lasted 6 hr - all day.  She photophobia, phonophobia, and nausea/vomiting.  During these spells, she has to lay down in a quiet dark room.  She was given samples or relpax which she has only used once.  Of note, she is questioning whether new onset migraines are associated with her menstrual cycle, because they started one week prior to her menstruating.   She gets headaches about twice per week, described as achy/throbbing bitemporal pain.  She usually takes ibuprofen 200mg  (4 tablets) which alleviates.   No prior history of migraines.    Out-side paper records, electronic medical record, and  images have been reviewed where available and summarized as:  CT head 09/21/2013: Right parietal scalp soft tissue swelling, without acute intracranial abnormality. Mild sinus disease.  Lab Results  Component Value Date   TSH 0.478 09/28/2013    Past Medical History  Diagnosis Date  . Thyroid disease   . Allergy     Past Surgical History  Procedure Laterality Date  . Eardrum surgery      x3, left     Medications:  Current Outpatient Prescriptions on File Prior to Visit  Medication Sig Dispense Refill  . citalopram (CELEXA) 10 MG tablet Take 10 mg by mouth daily.      . fluticasone (FLONASE) 50 MCG/ACT nasal spray Place 2 sprays into both nostrils daily as needed for rhinitis.  48 g  0  . IBUPROFEN PO Take 200 mg by mouth as needed.      Marland Kitchen levothyroxine (SYNTHROID, LEVOTHROID) 125 MCG tablet Take 1 TABLET by mouth daily before breakfast  90 tablet  0  . loratadine (CLARITIN) 10 MG tablet Take 10 mg by mouth daily.      . Norethindrone-Eth Estradiol (NORTREL 1/35, 21, PO) Take by mouth daily.       No current facility-administered medications on file prior to visit.    Allergies:  Allergies  Allergen Reactions  . Latex     Itching, red   . Tapazole [Methimazole]     Severe joint pain    Family History: Family History  Problem Relation Age of Onset  . Hypothyroidism Maternal Grandmother   . Graves'  disease Mother   . Colon polyps Mother   . Breast cancer Paternal Aunt   . Brain cancer Paternal Grandmother     Tumor  . Colon cancer Neg Hx   . Esophageal cancer Neg Hx   . Stomach cancer Neg Hx   . Rectal cancer Neg Hx   . Migraines Maternal Aunt   . Healthy Son     Social History: History   Social History  . Marital Status: Single    Spouse Name: N/A    Number of Children: 1  . Years of Education: N/A   Occupational History  . Receptionist    Social History Main Topics  . Smoking status: Former Research scientist (life sciences)  . Smokeless tobacco: Never Used     Comment:  Quit in her 78s, smoked 3 years.  . Alcohol Use: Yes     Comment: infrequently (once every 1-2 months)  . Drug Use: No  . Sexual Activity: Not on file   Other Topics Concern  . Not on file   Social History Narrative   Lives with boyfriend in 2 story home.  Has one child.     Education: some college.     Works as a Research scientist (physical sciences) at [employer].      Review of Systems:  CONSTITUTIONAL: No fevers, chills, night sweats, or weight loss.   EYES: No visual changes or eye pain ENT: No hearing changes.  No history of nose bleeds.   RESPIRATORY: No cough, wheezing and shortness of breath.   CARDIOVASCULAR: Negative for chest pain, and palpitations.   GI: Negative for abdominal discomfort, blood in stools or black stools.  No recent change in bowel habits.   GU:  No history of incontinence.   MUSCLOSKELETAL: No history of joint pain or swelling.  No myalgias.   SKIN: Negative for lesions, rash, and itching.   HEMATOLOGY/ONCOLOGY: Negative for prolonged bleeding, bruising easily, and swollen nodes.  No history of cancer.   ENDOCRINE: Negative for cold or heat intolerance, polydipsia or goiter.   PSYCH:  No depression or anxiety symptoms.   NEURO: As Above.   Vital Signs:  BP 130/80  Pulse 80  Ht 5\' 6"  (1.676 m)  Wt 185 lb 7 oz (84.114 kg)  BMI 29.94 kg/m2  SpO2 96%   General Medical Exam:  General:  Well appearing, comfortable.   Eyes/ENT: see cranial nerve examination.   Neck: No masses appreciated.  Full range of motion without tenderness.  No carotid bruits. Respiratory:  Clear to auscultation, good air entry bilaterally.   Cardiac:  Regular rate and rhythm, no murmur.   Extremities:  No deformities, edema, or skin discoloration. Good capillary refill.   Skin:  Skin color, texture, turgor normal. No rashes or lesions.  Neurological Exam: MENTAL STATUS including orientation to time, place, person, recent and remote memory, attention span and concentration, language,  and fund of knowledge is normal.  Speech is not dysarthric.  CRANIAL NERVES: II:  No visual field defects.  Unremarkable fundi.   III-IV-VI: Pupils equal round and reactive to light.  Normal conjugate, extra-ocular eye movements in all directions of gaze.  No nystagmus.  No ptosis.  V:  Normal facial sensation.   VII:  Normal facial symmetry and movements.  Marland Kitchen  VIII:  Normal hearing and vestibular function.   IX-X:  Normal palatal movement.   XI:  Normal shoulder shrug and head rotation.   XII:  Normal tongue strength and range of motion, no deviation or  fasciculation.  MOTOR:  No atrophy, fasciculations or abnormal movements.  No pronator drift.  Tone is normal.    Right Upper Extremity:    Left Upper Extremity:    Deltoid  5/5   Deltoid  5/5   Biceps  5/5   Biceps  5/5   Triceps  5/5   Triceps  5/5   Wrist extensors  5/5   Wrist extensors  5/5   Wrist flexors  5/5   Wrist flexors  5/5   Finger extensors  5/5   Finger extensors  5/5   Finger flexors  5/5   Finger flexors  5/5   Dorsal interossei  5/5   Dorsal interossei  5/5   Abductor pollicis  5/5   Abductor pollicis  5/5   Tone (Ashworth scale)  0  Tone (Ashworth scale)  0   Right Lower Extremity:    Left Lower Extremity:    Hip flexors  5/5   Hip flexors  5/5   Hip extensors  5/5   Hip extensors  5/5   Knee flexors  5/5   Knee flexors  5/5   Knee extensors  5/5   Knee extensors  5/5   Dorsiflexors  5/5   Dorsiflexors  5/5   Plantarflexors  5/5   Plantarflexors  5/5   Toe extensors  5/5   Toe extensors  5/5   Toe flexors  5/5   Toe flexors  5/5   Tone (Ashworth scale)  0  Tone (Ashworth scale)  0   MSRs:  Right                                                                 Left brachioradialis 2+  brachioradialis 2+  biceps 2+  biceps 2+  triceps 2+  triceps 2+  patellar 2+  patellar 2+  ankle jerk 2+  ankle jerk 2+  Hoffman no  Hoffman no  plantar response down  plantar response down   SENSORY:  Normal and  symmetric perception of light touch, pinprick, vibration, and proprioception.  Romberg's sign absent.   COORDINATION/GAIT: Normal finger-to- nose-finger and heel-to-shin.  Intact rapid alternating movements bilaterally.   Gait narrow based and stable.    IMPRESSION: 1.  Episodic migraine with visual auras, symptoms starting early 07/2014  - CT head reviewed from 09/2013 with does not show any intracranial abnormalities  - Exam is non-focal and with typical history will hold off on additional imaging at this time  - She does not have headaches >15 days per month to start daily preventative medication, so will treat acute symptoms  2.  Tension headaches, responsive to NSAIDs  PLAN/RECOMMENDATIONS:  1.  For severe migraine, take relpax at headache onset, if no improvement in two hours, ok to take another tablet. 2.  For moderate headaches, take ansaid 50mg  twice daily as needed for pain, combine with compazine 5mg  and/or benedryl 25mg   3.  Keep a migraine journal  4.  Return to clinic 58-months, or sooner as needed   The duration of this appointment visit was 40 minutes of face-to-face time with the patient.  Greater than 50% of this time was spent in counseling, explanation of diagnosis, planning of further management, and coordination of care.   Thank  you for allowing me to participate in patient's care.  If I can answer any additional questions, I would be pleased to do so.    Sincerely,    Xiao Graul K. Posey Pronto, DO

## 2014-08-12 NOTE — Patient Instructions (Addendum)
1.  For severe migraine, take relpax at headache onset, if no improvement in two hours, ok to take another tablet. 2.  For moderate headaches, take ansaid 50mg  twice daily as needed for pain, combine with compazine 5mg  and/or benedryl 25mg   3.  Keep a migraine journal  4.  Return to clinic 37-months, or sooner as needed

## 2014-08-17 ENCOUNTER — Telehealth: Payer: Self-pay | Admitting: Internal Medicine

## 2014-08-17 NOTE — Telephone Encounter (Signed)
Patient would like last office note indicating that she is on Linzess sent to Dr Theodoro Doing. This has been sent. She was advised that in order for [place] to provide Linzess refills, she would need office visit as it has been over 2 years since we have seen her. She tells me that actually, her PCP, Dr Nancy Fetter will be giving this to her.

## 2014-08-31 ENCOUNTER — Other Ambulatory Visit: Payer: Self-pay

## 2014-08-31 DIAGNOSIS — Z1231 Encounter for screening mammogram for malignant neoplasm of breast: Secondary | ICD-10-CM

## 2014-09-05 ENCOUNTER — Ambulatory Visit
Admission: RE | Admit: 2014-09-05 | Discharge: 2014-09-05 | Disposition: A | Discharge status: Home / Self Care | Payer: BC Managed Care – PPO | Source: Ambulatory Visit

## 2014-09-05 DIAGNOSIS — Z1231 Encounter for screening mammogram for malignant neoplasm of breast: Secondary | ICD-10-CM

## 2014-10-18 ENCOUNTER — Telehealth: Payer: Self-pay | Admitting: Neurology

## 2014-10-18 NOTE — Telephone Encounter (Signed)
Pt cancelled January appt with Dr. Posey Pronto. She says she has not had a migraine since she has been seen / Sherri S.

## 2014-11-14 ENCOUNTER — Ambulatory Visit: Payer: Self-pay | Admitting: Neurology

## 2015-02-27 ENCOUNTER — Other Ambulatory Visit (HOSPITAL_COMMUNITY)
Admission: RE | Admit: 2015-02-27 | Discharge: 2015-02-27 | Disposition: A | Discharge status: Home / Self Care | Payer: BLUE CROSS/BLUE SHIELD | Source: Ambulatory Visit | Attending: Obstetrics and Gynecology | Admitting: Obstetrics and Gynecology

## 2015-02-27 ENCOUNTER — Other Ambulatory Visit (HOSPITAL_COMMUNITY): Payer: Self-pay | Admitting: Obstetrics and Gynecology

## 2015-02-27 DIAGNOSIS — Z01419 Encounter for gynecological examination (general) (routine) without abnormal findings: Secondary | ICD-10-CM | POA: Insufficient documentation

## 2015-02-28 LAB — CYTOLOGY - PAP

## 2015-06-16 ENCOUNTER — Other Ambulatory Visit: Payer: Self-pay | Admitting: Physician Assistant

## 2015-06-16 DIAGNOSIS — R1032 Left lower quadrant pain: Secondary | ICD-10-CM

## 2015-07-07 ENCOUNTER — Ambulatory Visit
Admission: RE | Admit: 2015-07-07 | Discharge: 2015-07-07 | Disposition: A | Discharge status: Home / Self Care | Payer: BLUE CROSS/BLUE SHIELD | Source: Ambulatory Visit | Attending: Physician Assistant | Admitting: Physician Assistant

## 2015-07-07 ENCOUNTER — Other Ambulatory Visit: Payer: Self-pay

## 2015-07-07 DIAGNOSIS — R1032 Left lower quadrant pain: Secondary | ICD-10-CM

## 2015-07-07 MED ORDER — IOPAMIDOL (ISOVUE-300) INJECTION 61%
100.0000 mL | Freq: Once | INTRAVENOUS | Status: AC | PRN
  Administered 2015-07-07: 100 mL via INTRAVENOUS

## 2015-07-10 ENCOUNTER — Other Ambulatory Visit: Payer: Self-pay

## 2016-03-01 ENCOUNTER — Other Ambulatory Visit (HOSPITAL_COMMUNITY)
Admission: RE | Admit: 2016-03-01 | Discharge: 2016-03-01 | Disposition: A | Discharge status: Home / Self Care | Payer: BLUE CROSS/BLUE SHIELD | Source: Ambulatory Visit | Attending: Obstetrics and Gynecology | Admitting: Obstetrics and Gynecology

## 2016-03-01 ENCOUNTER — Other Ambulatory Visit: Payer: Self-pay | Admitting: Obstetrics and Gynecology

## 2016-03-01 DIAGNOSIS — Z01419 Encounter for gynecological examination (general) (routine) without abnormal findings: Secondary | ICD-10-CM | POA: Insufficient documentation

## 2016-03-04 LAB — CYTOLOGY - PAP

## 2016-08-15 ENCOUNTER — Other Ambulatory Visit: Payer: Self-pay | Admitting: Family Medicine

## 2016-08-15 DIAGNOSIS — Z1231 Encounter for screening mammogram for malignant neoplasm of breast: Secondary | ICD-10-CM

## 2016-09-05 ENCOUNTER — Ambulatory Visit
Admission: RE | Admit: 2016-09-05 | Discharge: 2016-09-05 | Disposition: A | Discharge status: Home / Self Care | Payer: PRIVATE HEALTH INSURANCE | Source: Ambulatory Visit | Attending: Family Medicine | Admitting: Family Medicine

## 2016-09-05 DIAGNOSIS — Z1231 Encounter for screening mammogram for malignant neoplasm of breast: Secondary | ICD-10-CM

## 2017-04-21 ENCOUNTER — Other Ambulatory Visit: Payer: Self-pay | Admitting: Obstetrics and Gynecology

## 2017-04-21 ENCOUNTER — Other Ambulatory Visit (HOSPITAL_COMMUNITY)
Admission: RE | Admit: 2017-04-21 | Discharge: 2017-04-21 | Disposition: A | Discharge status: Home / Self Care | Payer: PRIVATE HEALTH INSURANCE | Source: Ambulatory Visit | Attending: Obstetrics and Gynecology | Admitting: Obstetrics and Gynecology

## 2017-04-21 DIAGNOSIS — Z124 Encounter for screening for malignant neoplasm of cervix: Secondary | ICD-10-CM | POA: Diagnosis present

## 2017-04-24 LAB — CYTOLOGY - PAP
DIAGNOSIS: NEGATIVE
HPV: NOT DETECTED

## 2017-07-25 ENCOUNTER — Other Ambulatory Visit: Payer: Self-pay | Admitting: Family Medicine

## 2017-07-25 DIAGNOSIS — Z1231 Encounter for screening mammogram for malignant neoplasm of breast: Secondary | ICD-10-CM

## 2017-09-08 ENCOUNTER — Ambulatory Visit
Admission: RE | Admit: 2017-09-08 | Discharge: 2017-09-08 | Disposition: A | Discharge status: Home / Self Care | Payer: PRIVATE HEALTH INSURANCE | Source: Ambulatory Visit | Attending: Family Medicine | Admitting: Family Medicine

## 2017-09-08 DIAGNOSIS — Z1231 Encounter for screening mammogram for malignant neoplasm of breast: Secondary | ICD-10-CM

## 2017-09-10 ENCOUNTER — Ambulatory Visit: Payer: Self-pay

## 2018-04-02 ENCOUNTER — Other Ambulatory Visit: Payer: Self-pay | Admitting: Family Medicine

## 2018-04-02 DIAGNOSIS — I80291 Phlebitis and thrombophlebitis of other deep vessels of right lower extremity: Secondary | ICD-10-CM

## 2018-04-24 ENCOUNTER — Ambulatory Visit
Admission: RE | Admit: 2018-04-24 | Discharge: 2018-04-24 | Disposition: A | Discharge status: Home / Self Care | Payer: No Typology Code available for payment source | Source: Ambulatory Visit | Attending: Family Medicine | Admitting: Family Medicine

## 2018-04-24 ENCOUNTER — Other Ambulatory Visit: Payer: Self-pay

## 2018-04-24 DIAGNOSIS — I80291 Phlebitis and thrombophlebitis of other deep vessels of right lower extremity: Secondary | ICD-10-CM

## 2018-07-31 ENCOUNTER — Other Ambulatory Visit: Payer: Self-pay | Admitting: Family Medicine

## 2018-07-31 DIAGNOSIS — Z1231 Encounter for screening mammogram for malignant neoplasm of breast: Secondary | ICD-10-CM

## 2018-09-10 ENCOUNTER — Ambulatory Visit
Admission: RE | Admit: 2018-09-10 | Discharge: 2018-09-10 | Disposition: A | Discharge status: Home / Self Care | Payer: PRIVATE HEALTH INSURANCE | Source: Ambulatory Visit | Attending: Family Medicine | Admitting: Family Medicine

## 2018-09-10 DIAGNOSIS — Z1231 Encounter for screening mammogram for malignant neoplasm of breast: Secondary | ICD-10-CM

## 2019-09-24 ENCOUNTER — Other Ambulatory Visit: Payer: Self-pay | Admitting: Family Medicine

## 2019-09-24 DIAGNOSIS — Z1231 Encounter for screening mammogram for malignant neoplasm of breast: Secondary | ICD-10-CM

## 2019-11-09 ENCOUNTER — Ambulatory Visit: Payer: PRIVATE HEALTH INSURANCE

## 2019-12-07 ENCOUNTER — Ambulatory Visit
Admission: RE | Admit: 2019-12-07 | Discharge: 2019-12-07 | Disposition: A | Discharge status: Home / Self Care | Payer: PRIVATE HEALTH INSURANCE | Source: Ambulatory Visit | Attending: Family Medicine | Admitting: Family Medicine

## 2019-12-07 ENCOUNTER — Other Ambulatory Visit: Payer: Self-pay

## 2019-12-07 DIAGNOSIS — Z1231 Encounter for screening mammogram for malignant neoplasm of breast: Secondary | ICD-10-CM

## 2020-10-17 ENCOUNTER — Other Ambulatory Visit (HOSPITAL_COMMUNITY): Payer: Self-pay | Admitting: Pulmonary Disease

## 2020-12-14 ENCOUNTER — Other Ambulatory Visit: Payer: Self-pay | Admitting: Family Medicine

## 2020-12-14 DIAGNOSIS — Z1231 Encounter for screening mammogram for malignant neoplasm of breast: Secondary | ICD-10-CM

## 2021-02-05 ENCOUNTER — Inpatient Hospital Stay: Admission: RE | Admit: 2021-02-05 | Payer: PRIVATE HEALTH INSURANCE | Source: Ambulatory Visit

## 2021-02-10 ENCOUNTER — Other Ambulatory Visit: Payer: Self-pay

## 2021-02-10 ENCOUNTER — Ambulatory Visit
Admission: RE | Admit: 2021-02-10 | Discharge: 2021-02-10 | Disposition: A | Discharge status: Home / Self Care | Payer: PRIVATE HEALTH INSURANCE | Source: Ambulatory Visit | Attending: Family Medicine | Admitting: Family Medicine

## 2021-02-10 DIAGNOSIS — Z1231 Encounter for screening mammogram for malignant neoplasm of breast: Secondary | ICD-10-CM

## 2021-11-27 ENCOUNTER — Ambulatory Visit
Admission: RE | Admit: 2021-11-27 | Discharge: 2021-11-27 | Disposition: A | Discharge status: Home / Self Care | Payer: No Typology Code available for payment source | Source: Ambulatory Visit | Attending: Family Medicine | Admitting: Family Medicine

## 2021-11-27 ENCOUNTER — Other Ambulatory Visit: Payer: Self-pay | Admitting: Family Medicine

## 2021-11-27 DIAGNOSIS — R1030 Lower abdominal pain, unspecified: Secondary | ICD-10-CM

## 2022-02-13 ENCOUNTER — Other Ambulatory Visit: Payer: Self-pay | Admitting: Family Medicine

## 2022-02-13 DIAGNOSIS — Z1231 Encounter for screening mammogram for malignant neoplasm of breast: Secondary | ICD-10-CM

## 2022-02-26 ENCOUNTER — Ambulatory Visit
Admission: RE | Admit: 2022-02-26 | Discharge: 2022-02-26 | Disposition: A | Discharge status: Home / Self Care | Payer: No Typology Code available for payment source | Source: Ambulatory Visit | Attending: Family Medicine | Admitting: Family Medicine

## 2022-02-26 ENCOUNTER — Encounter: Payer: Self-pay | Admitting: Radiology

## 2022-02-26 DIAGNOSIS — Z1231 Encounter for screening mammogram for malignant neoplasm of breast: Secondary | ICD-10-CM

## 2022-05-08 ENCOUNTER — Encounter: Payer: Self-pay | Admitting: Internal Medicine

## 2022-08-30 IMAGING — CT CT ABD-PELV W/O CM
2 of 4 series · 12 of 46 positions shown, 14 images · non-contrast
Comparison: 07/07/2015

CLINICAL DATA: Increasing lower abdominal pain and bloating



[Series 2: routine abdomen pelvis without 5.00 br40 s3 axial · axial · non-contrast · 0.57mm/px · z∈[+1072,+1462]mm · 9 of 94 slices shown, 11 images]
[im 8/94  soft-tissue]
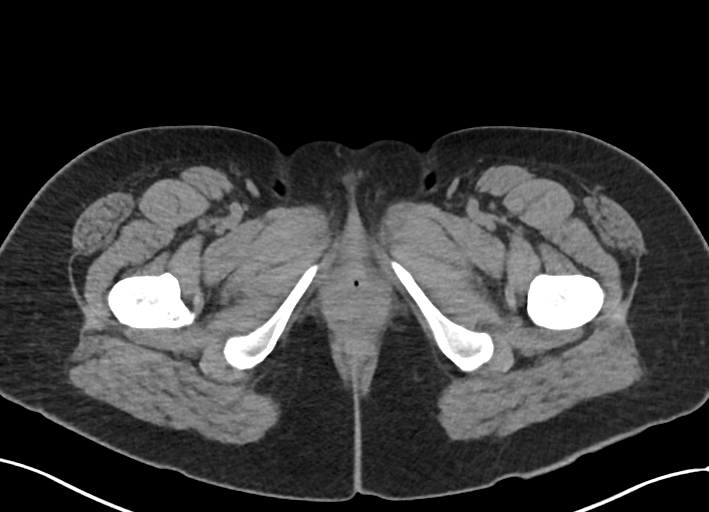
[im 8/94  bone]
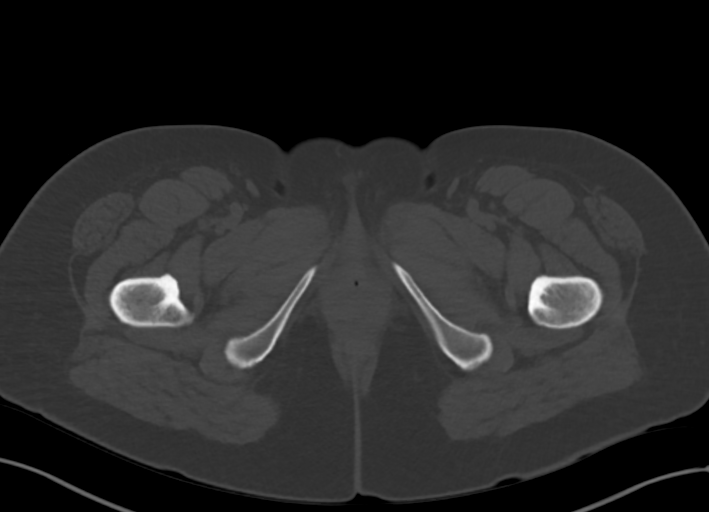
[im 16/94  soft-tissue]
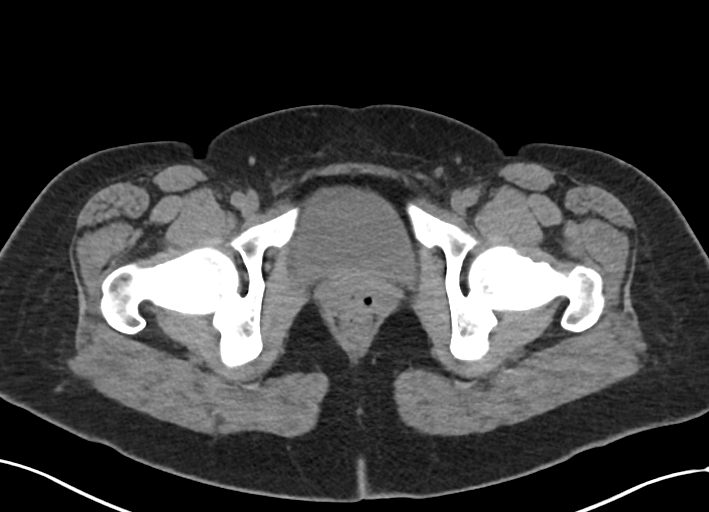
[im 28/94  soft-tissue]
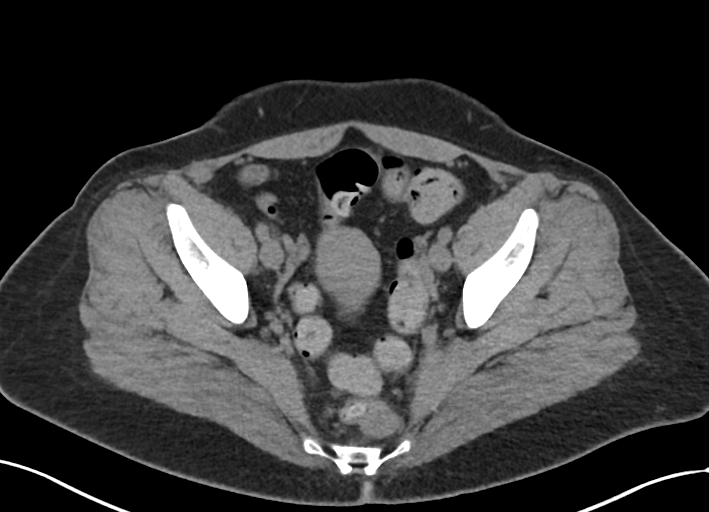
[im 35/94  soft-tissue]
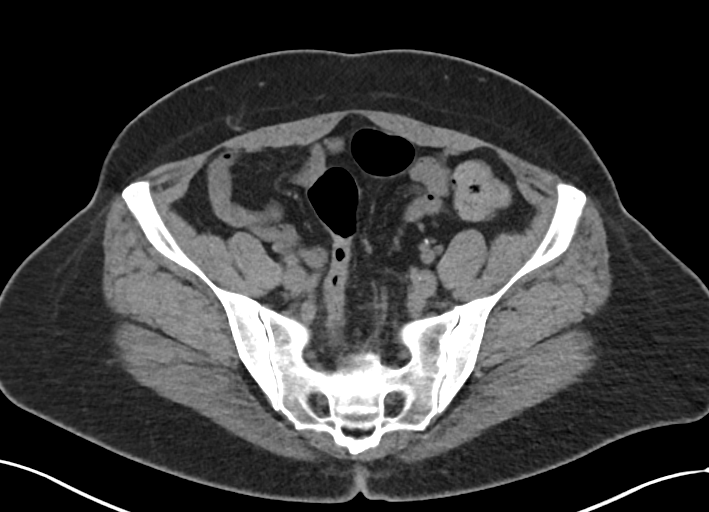
[im 47/94  soft-tissue]
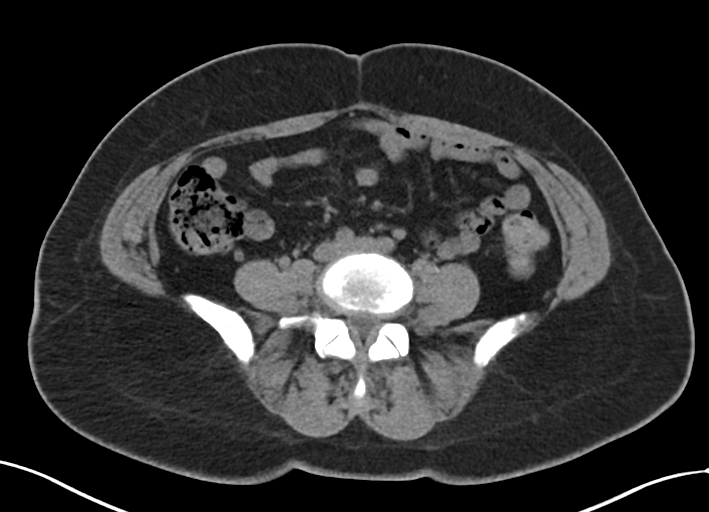
[im 59/94  soft-tissue]
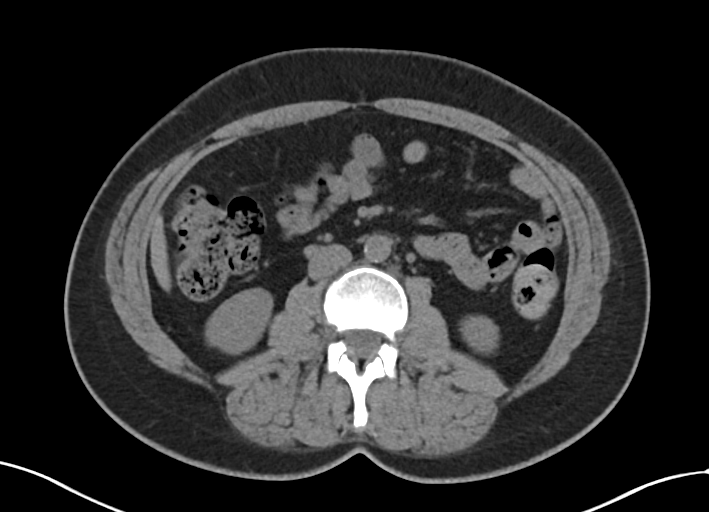
[im 66/94  soft-tissue]
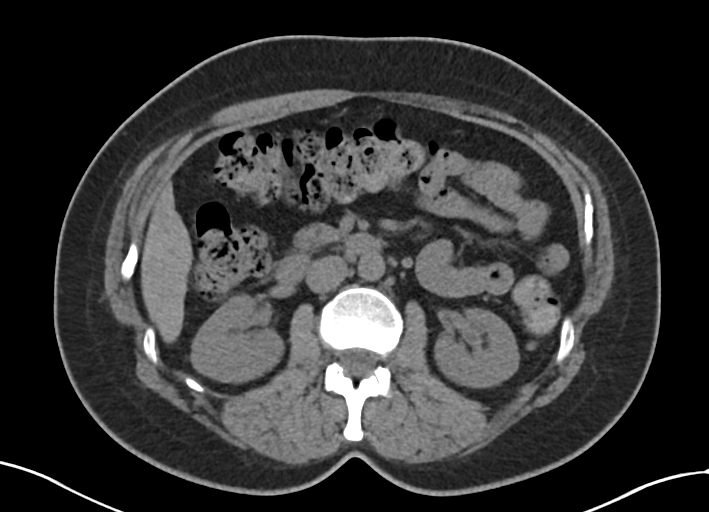
[im 78/94  soft-tissue]
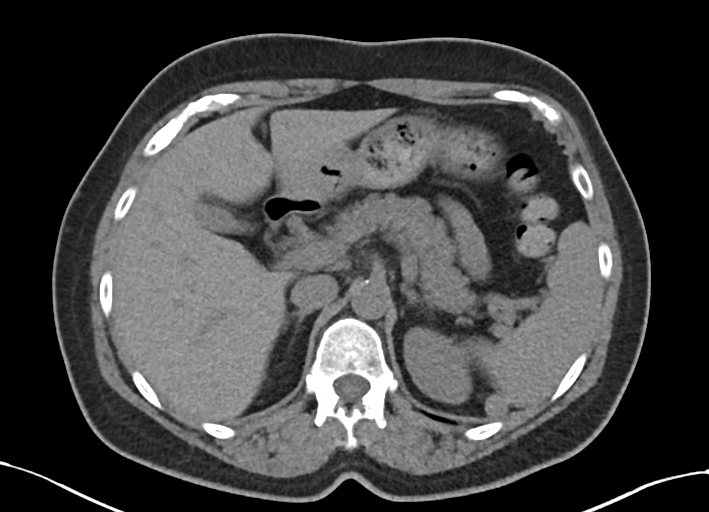
[im 86/94  soft-tissue]
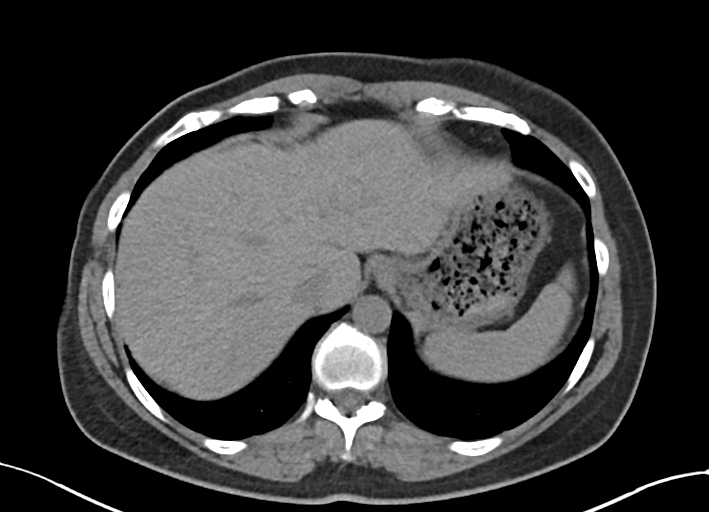
[im 86/94  bone]
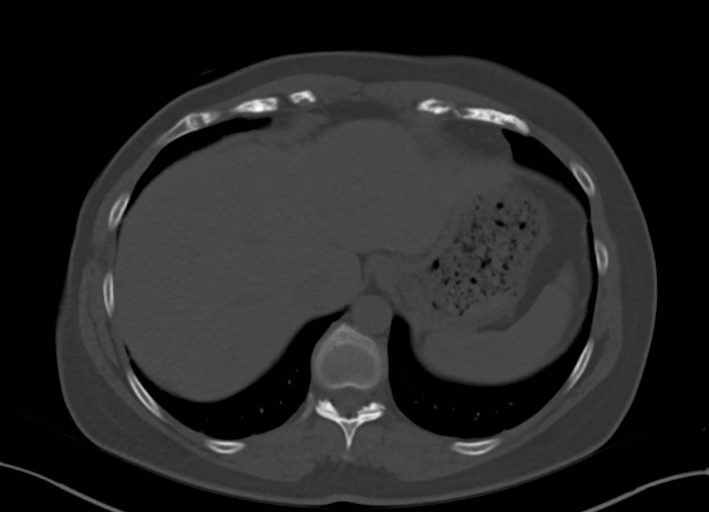

[Series 4: routine abdomen pelvis without 2.00 br40 s3 cor · coronal · non-contrast · 0.79mm/px · 3 of 146 slices shown]
[im 49/146  soft-tissue]
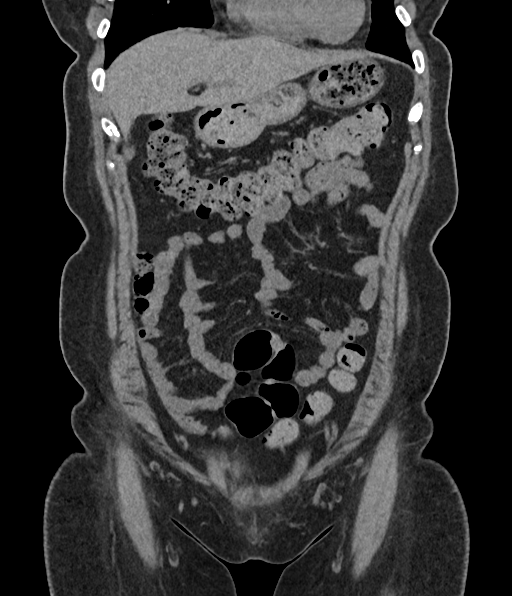
[im 65/146  soft-tissue]
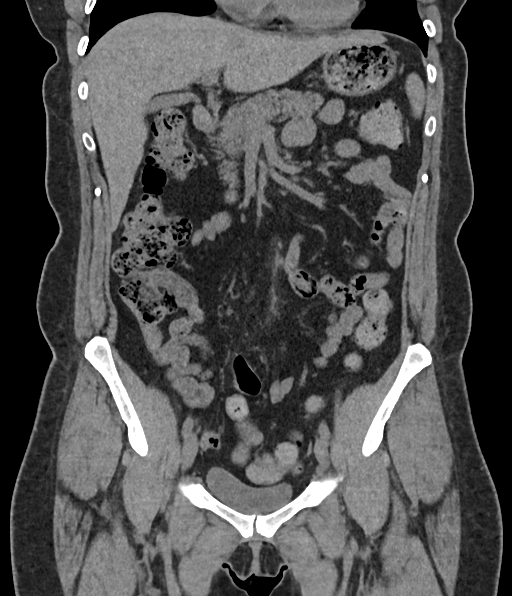
[im 81/146  soft-tissue]
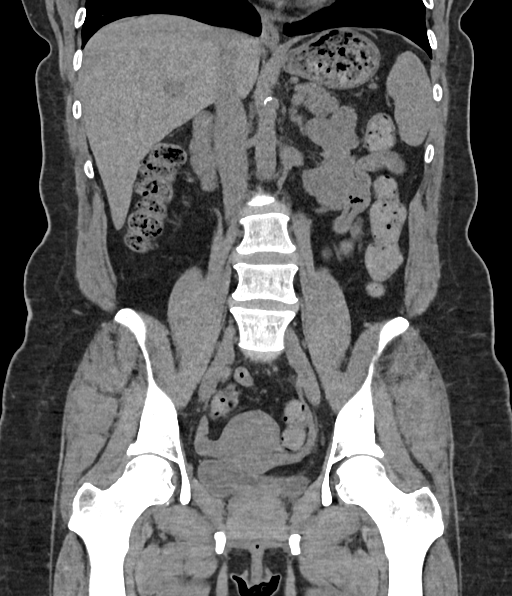

[12 of 46 positions shown; findings below may reference images not displayed]

FINDINGS: Lower chest: Included lung bases are clear.  Heart size is normal.

Hepatobiliary: Unremarkable unenhanced appearance of the liver. No
focal liver lesion identified. Gallbladder within normal limits. No
hyperdense gallstone. No biliary dilatation.

Pancreas: Unremarkable. No pancreatic ductal dilatation or
surrounding inflammatory changes.

Spleen: Normal in size without focal abnormality.

Adrenals/Urinary Tract: Adrenal glands are unremarkable. Kidneys are
normal, without renal calculi, focal lesion, or hydronephrosis.
Bladder is unremarkable.

Stomach/Bowel: Stomach within normal limits. No dilated loops of
bowel. Normal appendix in the right lower quadrant (series 2, image
45). Moderate volume of stool throughout the colon. Mild rectal wall
thickening circumferentially.

Vascular/Lymphatic: Scattered aortoiliac atherosclerotic
calcifications without aneurysm. No abdominopelvic lymphadenopathy.

Reproductive: Lobulated uterine contour likely representing
fibroids. Bilateral adnexa appear unremarkable.

Other: No free fluid. No abdominopelvic fluid collection. No
pneumoperitoneum. No abdominal wall hernia.

Musculoskeletal: No acute or significant osseous findings.
IMPRESSION: 1. Moderate volume of stool throughout the colon with mild rectal
wall thickening, which may reflect a mild proctitis.
2. Fibroid uterus.
3. Aortic atherosclerosis ([id number]).

## 2022-12-26 ENCOUNTER — Other Ambulatory Visit (HOSPITAL_COMMUNITY): Payer: Self-pay

## 2022-12-26 ENCOUNTER — Ambulatory Visit (HOSPITAL_COMMUNITY)
Admission: RE | Admit: 2022-12-26 | Discharge: 2022-12-26 | Disposition: A | Discharge status: Home / Self Care | Payer: No Typology Code available for payment source | Source: Ambulatory Visit | Attending: Family Medicine | Admitting: Family Medicine

## 2022-12-26 ENCOUNTER — Other Ambulatory Visit (HOSPITAL_COMMUNITY): Payer: Self-pay | Admitting: Family Medicine

## 2022-12-26 DIAGNOSIS — R52 Pain, unspecified: Secondary | ICD-10-CM

## 2022-12-26 DIAGNOSIS — M79604 Pain in right leg: Secondary | ICD-10-CM

## 2022-12-26 DIAGNOSIS — I82401 Acute embolism and thrombosis of unspecified deep veins of right lower extremity: Secondary | ICD-10-CM

## 2022-12-26 DIAGNOSIS — I82491 Acute embolism and thrombosis of other specified deep vein of right lower extremity: Secondary | ICD-10-CM

## 2022-12-26 HISTORY — DX: Acute embolism and thrombosis of unspecified deep veins of right lower extremity: I82.401

## 2022-12-26 NOTE — Progress Notes (Signed)
Lower extremity venous right study completed.  Preliminary results relayed to Mannie Stabile, MD. Offered DVT clinic services. Office has provided anticoagulation starter and will manage patient treatment.  See CV Proc for preliminary results report.   Darlin Coco, RDMS, RVT

## 2022-12-30 ENCOUNTER — Other Ambulatory Visit (HOSPITAL_COMMUNITY): Payer: Self-pay

## 2022-12-31 ENCOUNTER — Encounter (HOSPITAL_COMMUNITY): Payer: Self-pay

## 2022-12-31 ENCOUNTER — Ambulatory Visit (HOSPITAL_COMMUNITY)
Admission: RE | Admit: 2022-12-31 | Discharge: 2022-12-31 | Disposition: A | Discharge status: Home / Self Care | Payer: No Typology Code available for payment source | Source: Ambulatory Visit | Attending: Vascular Surgery | Admitting: Vascular Surgery

## 2022-12-31 VITALS — BP 138/87 | HR 75

## 2022-12-31 DIAGNOSIS — I82491 Acute embolism and thrombosis of other specified deep vein of right lower extremity: Secondary | ICD-10-CM

## 2022-12-31 DIAGNOSIS — I82409 Acute embolism and thrombosis of unspecified deep veins of unspecified lower extremity: Secondary | ICD-10-CM | POA: Insufficient documentation

## 2022-12-31 MED ORDER — RIVAROXABAN 20 MG PO TABS: 20.0000 mg | ORAL_TABLET | Freq: Every day | ORAL | 5 refills | Status: DC

## 2022-12-31 NOTE — Addendum Note (Signed)
Encounter addended by: Waynetta Sandy, MD on: 12/31/2022 4:32 PM  Actions taken: Clinical Note Signed

## 2022-12-31 NOTE — Patient Instructions (Signed)
-  Continue rivaroxaban (Xarelto) 15 mg twice daily with food for 21 days followed by 20 mg daily with food. -Your refills [facility] in [place].  -It is important to take your medication around the same time every day.  -Avoid NSAIDs like ibuprofen (Advil, Motrin) and naproxen (Aleve) as well as aspirin doses over 100 mg daily. -Tylenol (acetaminophen) is the preferred over the counter pain medication to lower the risk of bleeding. -Be sure to alert all of your health care providers that you are taking an anticoagulant prior to starting a new medication or having a procedure. -Monitor for signs and symptoms of bleeding (abnormal bruising, prolonged bleeding, nose bleeds, bleeding from gums, discolored urine, black tarry stools). If you have fallen and hit your head OR if your bleeding is severe or not stopping, seek emergency care.  -Go to the emergency room if emergent signs and symptoms of new clot occur (new or worse swelling and pain in an arm or leg, shortness of breath, chest pain, fast or irregular heartbeats, lightheadedness, dizziness, fainting, coughing up blood) or if you experience a significant color change (pale or blue) in the extremity that has the DVT.  -We recommend you wear compression stockings (20-30 mmHg) as long as you are having swelling or pain. Be sure to purchase the correct size and take them off at night.   Your next visit will be in the next 3 months.  [facility] [place], [place], [place] [address] Enter the hospital through Entrance [address] off [address] and pull up to the [address] entrance to the free [place] parking.  Check in for your appointment at the [facility].   If you have any questions or need to reschedule an appointment, please call [phone] [facility].  If you are having an emergency, call 911 or present to the nearest emergency room.   What is a DVT?  -Deep vein thrombosis  (DVT) is a condition in which a blood clot forms in a vein of the deep venous system which can occur in the lower leg, thigh, pelvis, arm, or neck. This condition is serious and can be life-threatening if the clot travels to the arteries of the lungs and causing a blockage (pulmonary embolism, PE). A DVT can also damage veins in the leg, which can lead to long-term venous disease, leg pain, swelling, discoloration, and ulcers or sores (post-thrombotic syndrome).  -Treatment may include taking an anticoagulant medication to prevent more clots from forming and the current clot from growing, wearing compression stockings, and/or surgical procedures to remove or dissolve the clot.

## 2022-12-31 NOTE — Progress Notes (Addendum)
DVT Clinic Note  Name: Renee Mendez     MRN: [id number]     DOB: [birth year]     Sex: female  PCP: Donald Prose, MD  Today's Visit: Visit Information: Initial Visit  Referred to DVT Clinic by: Dr. Mannie Stabile [facility])  Referred to CPP by: Dr. Donzetta Matters Reason for referral:  Chief Complaint  Patient presents with   Med Management - DVT   HISTORY OF PRESENT ILLNESS:  Renee Mendez is a 52 y.o. female with PMH hypothyroidism who presents after diagnosis of DVT for medication management. Patient reports she first noticed soreness in calf about 4 months ago. It lasted a couple days then went away. Then about 2 months ago she had a similar soreness behind the knee which also went away after a few days. Then last Monday patient start experiencing stinging, swelling, warmth, and redness in her upper medial thigh. Unclear if previous soreness related to this. She works at [employer] and asked one of her doctors about her current symptoms who ordered an [place] which found acute DVT in the common femoral vein. They provided her with a starter pack of Xarelto, which she has been taking since without problems. Swelling and pain in the leg has much improved since starting Xarelto. She notices when she is sitting or standing a certain way that she can feel it, but otherwise no pain. No recent travel (last airplane trip was in August). Thinks her grandmother had blood clots but no other family history of VTE. No history of miscarriages. She reports being up to date on cancer screenings. She called her OBGYN after DVT diagnosis who told her to discontinue her birth control which contained estrogen. She had been taking it for 32 years.   Positive Thrombotic Risk Factors: Estrogen therapy (has been on the same birth control for 32 years) Bleeding Risk Factors: Anticoagulant therapy  Negative Thrombotic Risk Factors: Sedentary journey lasting >8 hours within 4 weeks, Previous VTE, Recent  surgery (within 3 months), Recent trauma (within 3 months), Recent admission to hospital with acute illness (within 3 months), Bed rest >72 hours within 3 months, Recent COVID diagnosis (within 3 months), Older age, Testosterone therapy, Pregnancy, Central venous catheterization, Paralysis, paresis, or recent plaster cast immobilization of lower extremity, Recent cesarean section (within 3 months), Within 6 weeks postpartum, Erythropoiesis-stimulating agent, Obesity, Smoking, Known thrombophilic condition, Active cancer, Non-malignant, chronic inflammatory condition  Rx Insurance Coverage: Commercial Rx Affordability: Xarelto is $164 for 30 days. Provided patient with copay card to reduce cost to $10/month.  Preferred [facility]acy: [facility] [place]  Past Medical History:  Diagnosis Date   Allergy    Thyroid disease     Past Surgical History:  Procedure Laterality Date   Eardrum Surgery     x3, left    Social History   Socioeconomic History   Marital status: Single    Spouse name: Not on file   Number of children: 1   Years of education: Not on file   Highest education level: Not on file  Occupational History   Occupation: Receptionist    Employer: [employer]  Tobacco Use   Smoking status: Former   Smokeless tobacco: Never   Tobacco comments:    Quit in her 107s, smoked 3 years.  Substance and Sexual Activity   Alcohol use: Yes    Comment: infrequently (once every 1-2 months)   Drug use: No   Sexual activity: Not on file  Other Topics Concern  Not on file  Social History Narrative   Lives with boyfriend in 2 story home.  Has one child.     Education: some college.     Works as a Research scientist (physical sciences) at [employer].     Social Determinants of Health   Financial Resource Strain: Not on file  Food Insecurity: Not on file  Transportation Needs: Not on file  Physical Activity: Not on file  Stress: Not on file  Social Connections: Not on file  Intimate Partner Violence:  Not on file    Family History  Problem Relation Age of Onset   Graves' disease Mother    Colon polyps Mother    Migraines Maternal Aunt    Hypothyroidism Maternal Grandmother    Brain cancer Paternal Grandmother        Tumor   Healthy Son    Colon cancer Neg Hx    Esophageal cancer Neg Hx    Stomach cancer Neg Hx    Rectal cancer Neg Hx     Allergies as of 12/31/2022 - Review Complete 12/31/2022  Allergen Reaction Noted   Lubiprostone Shortness Of Breath 12/04/2021   Amoxicillin-pot clavulanate Other (See Comments) 12/04/2021   Clindamycin hcl Diarrhea 12/04/2021   Ketorolac tromethamine Other (See Comments) 12/04/2021   Latex  09/21/2013   Tapazole [methimazole]  12/13/2011    Current Outpatient Medications on File Prior to Encounter  Medication Sig Dispense Refill   citalopram (CELEXA) 40 MG tablet Take 40 mg by mouth daily.     fluticasone (FLONASE) 50 MCG/ACT nasal spray Place 2 sprays into both nostrils daily as needed for rhinitis. 48 g 0   levothyroxine (SYNTHROID) 100 MCG tablet Take 100 mcg by mouth daily before breakfast.     nystatin (MYCOSTATIN/NYSTOP) powder Apply 1 Application topically daily as needed.     No current facility-administered medications on file prior to encounter.   REVIEW OF SYSTEMS:  Review of Systems  Respiratory:  Negative for shortness of breath.   Cardiovascular:  Positive for leg swelling. Negative for chest pain and palpitations.  Musculoskeletal:  Positive for myalgias.  Neurological:  Positive for tingling. Negative for dizziness and headaches.   PHYSICAL EXAMINATION:  Vitals:   12/31/22 1410  BP: 138/87  Pulse: 75  SpO2: 100%   Physical Exam Vitals reviewed.  Cardiovascular:     Rate and Rhythm: Normal rate.  Pulmonary:     Effort: Pulmonary effort is normal.  Musculoskeletal:        General: No tenderness.     Right lower leg: Edema (trace) present.     Left lower leg: Edema (trace) present.  Skin:    Findings: No  erythema.  Psychiatric:        Mood and Affect: Mood normal.        Behavior: Behavior normal.        Thought Content: Thought content normal.   Villalta Score for Post-Thrombotic Syndrome: Pain: Mild Cramps: Mild Heaviness: Mild Paresthesia: Mild Pruritus: Mild Pretibial Edema: Mild Skin Induration: Absent Hyperpigmentation: Absent Redness: Absent Venous Ectasia: Mild Pain on calf compression: Mild Villalta Preliminary Score: 8 Is venous ulcer present?: No If venous ulcer is present and score is <15, then 15 points total are assigned: Absent Villalta Total Score: 8  LABS:  CBC     Component Value Date/Time   WBC 13.3 (H) 09/21/2013 2228   RBC 4.14 09/21/2013 2228   HGB 13.6 09/21/2013 2228   HCT 39.6 09/21/2013 2228   PLT 230  09/21/2013 2228   MCV 95.7 09/21/2013 2228   MCH 32.9 09/21/2013 2228   MCHC 34.3 09/21/2013 2228   RDW 12.8 09/21/2013 2228   LYMPHSABS 1.0 09/21/2013 2228   MONOABS 0.3 09/21/2013 2228   EOSABS 0.0 09/21/2013 2228   BASOSABS 0.0 09/21/2013 2228    Hepatic Function      Component Value Date/Time   PROT 6.7 02/14/2012 1514   ALBUMIN 4.0 02/14/2012 1514   AST 15 02/14/2012 1514   ALT 11 02/14/2012 1514   ALKPHOS 44 02/14/2012 1514   BILITOT 1.3 (H) 02/14/2012 1514    Renal Function   Lab Results  Component Value Date   CREATININE 0.64 09/21/2013   CREATININE 0.68 02/14/2012    CrCl cannot be calculated (Patient's most recent lab result is older than the maximum 21 days allowed.).   Most recent labs from 12/10/22 per [facility]:  Hgb 13.2, Hct 41.2%, RBC 4.43, WBC 5.5 Serum albumin 4.0, alk phos 76, ALT 30, AST 30, bilirubin (total) 0.5, BUN 8, calcium 8.9, chloride 99, serum creatinine 0.84, eGFR 84, glucose 111, sodium 136, potassium 4.2  [facility] Vascular Lab Studies:  12/26/22 VAS [place] LOWER EXTREMITY VENOUS (DVT)RIGHT  Summary:  RIGHT:  - Findings consistent with mobile, acute deep vein thrombosis involving  the right common femoral vein,  and SF junction.  - Findings consistent with acute superficial vein thrombosis involving the  right great saphenous vein.  - No cystic structure found in the popliteal fossa.    LEFT:  - No evidence of common femoral vein obstruction.   ASSESSMENT: Location of DVT: Right common femoral vein Cause of DVT: unprovoked - no major provoking risk factors found today per patient history. She has been taking an estrogen-containing contraceptive for >30 years so would not consider to be strong risk factor at this time. Birth control has been appropriately discontinued by her gynecologist given new hx DVT. Will refer to hematology for further work up and for guidance on duration of anticoagulation. At time of diagnosis, patient's physician consulted with Dr. Virl Cagey who advised no intervention was needed, and patient's pain and swelling have already significantly improved since starting Xarelto.   PLAN: -Continue rivaroxaban (Xarelto) 15 mg twice daily with food for 21 days followed by 20 mg daily with food. -Expected duration of therapy: Will defer decision on duration to hematology. Therapy started on 12/26/22. -Patient educated on purpose, proper use and potential adverse effects of rivaroxaban (Xarelto). -Discussed importance of taking medication around the same time every day. -Advised patient of medications to avoid (NSAIDs, aspirin doses >100 mg daily). -Educated that Tylenol (acetaminophen) is the preferred analgesic to lower the risk of bleeding. -Advised patient to alert all providers of anticoagulation therapy prior to starting a new medication or having a procedure. -Emphasized importance of monitoring for signs and symptoms of bleeding (abnormal bruising, prolonged bleeding, nose bleeds, bleeding from gums, discolored urine, black tarry stools). -Educated patient to present to the ED if emergent signs and symptoms of new thrombosis occur. -Counseled patient to wear compression stockings daily,  removing at night. -Refills sent in to her preferred pharmacy. Provided patient with copay card to reduce medication costs.   Follow up: in DVT Clinic in 3 months. Referral to hematology placed.   Rebbeca Paul, PharmD, [place], CPP [facility] Clinical Pharmacist Practitioner Office: [phone]  I have evaluated the patient's chart/imaging and refer this patient to the Clinical Pharmacist Practitioner for medication management. I have reviewed the CPP's documentation and  agree with her assessment and plan. I was immediately available during the visit for questions and collaboration.   Servando Snare, MD

## 2023-01-01 ENCOUNTER — Telehealth: Payer: Self-pay | Admitting: Physician Assistant

## 2023-01-01 NOTE — Telephone Encounter (Signed)
scheduled per 3/12 referral, pt has been called and confirmed date and time. Pt is aware of location and to arrive early for check in

## 2023-01-21 ENCOUNTER — Other Ambulatory Visit: Payer: Self-pay | Admitting: Family Medicine

## 2023-01-21 DIAGNOSIS — Z0000 Encounter for general adult medical examination without abnormal findings: Secondary | ICD-10-CM

## 2023-01-23 NOTE — Progress Notes (Unsigned)
[facility] [phone]   [phone]  INITIAL CONSULT NOTE  Patient Care Team: Deatra James, MD as PCP - General (Family Medicine) Olivia Mackie, MD as Attending Physician (Obstetrics and Gynecology)  Hematological/Oncological History 12/25/2021: Presented with swelling, redness and stinging of right lower extremity. Doppler US showed acute DVT involving right common femoral vein and SF junction. Additional there was acute superficial vein thrombosis involving right great saphenous vein. Started on Xarelto therapy and discontinued estrogen birth control.  01/24/2023: Establish care with [facility]  CHIEF COMPLAINTS/PURPOSE OF CONSULTATION:  "DVT right lower extremity "  HISTORY OF PRESENTING ILLNESS:  Renee Mendez 52 y.o. female with medical history significant for hypothyroidism presents to the hematology clinic for evaluation of acute DVT in the right lower extremity. She is accompanied by her husband for this visit.   On exam today, Renee Mendez reports that she has noticed improvement of swelling and pain in the right leg after starting Xarelto therapy. Unfortunately her menstrual bleeding has increase since discontinuing estrogen birth control and starting Xarelto. She reports her last cycle lasted 11 days with heavy bleeding. She is otherwise feeling well without any concerning symptoms. She denies fevers, chills, sweats, shortness of breath, chest pain, cough, nausea, vomiting, diarrhea or constipation. She has no other complaints. Rest of the 10 point ROS is below.   MEDICAL HISTORY:  Past Medical History:  Diagnosis Date   Allergy    Thyroid disease     SURGICAL HISTORY: Past Surgical History:  Procedure Laterality Date   Eardrum Surgery     x3, left    SOCIAL HISTORY: Social History   Socioeconomic History   Marital status: Single    Spouse name: Not on file   Number of children: 1   Years of education: Not on file    Highest education level: Not on file  Occupational History   Occupation: Receptionist    Employer: [employer]  Tobacco Use   Smoking status: Former   Smokeless tobacco: Never   Tobacco comments:    Quit in her 68s, smoked 3 years.  Substance and Sexual Activity   Alcohol use: Yes    Comment: infrequently (once every 1-2 months)   Drug use: No   Sexual activity: Not on file  Other Topics Concern   Not on file  Social History Narrative   Lives with boyfriend in 2 story home.  Has one child.     Education: some college.     Works as a Scientist, physiological at [employer].     Social Determinants of Health   Financial Resource Strain: Not on file  Food Insecurity: Not on file  Transportation Needs: Not on file  Physical Activity: Not on file  Stress: Not on file  Social Connections: Not on file  Intimate Partner Violence: Not on file    FAMILY HISTORY: Family History  Problem Relation Age of Onset   Graves' disease Mother    Colon polyps Mother    Hypothyroidism Maternal Grandmother    Deep vein thrombosis Maternal Grandmother    Brain cancer Paternal Grandmother        Tumor   Healthy Son    Migraines Maternal Aunt    Colon cancer Neg Hx    Esophageal cancer Neg Hx    Stomach cancer Neg Hx    Rectal cancer Neg Hx     ALLERGIES:  is allergic to lubiprostone, amoxicillin-pot clavulanate, clindamycin hcl, ketorolac tromethamine, latex, and tapazole [methimazole].  MEDICATIONS:  Current Outpatient Medications  Medication Sig Dispense Refill   citalopram (CELEXA) 40 MG tablet Take 40 mg by mouth daily.     fluticasone (FLONASE) 50 MCG/ACT nasal spray Place 2 sprays into both nostrils daily as needed for rhinitis. 48 g 0   Homeopathic Products (ALLERGY MEDICINE PO) Take by mouth.     levothyroxine (SYNTHROID) 100 MCG tablet Take 100 mcg by mouth daily before breakfast.     nystatin (MYCOSTATIN/NYSTOP) powder Apply 1 Application topically daily as needed.      rivaroxaban (XARELTO) 20 MG TABS tablet Take 1 tablet (20 mg total) by mouth daily with supper. Take with food. 30 tablet 5   No current facility-administered medications for this visit.    REVIEW OF SYSTEMS:   Constitutional: ( - ) fevers, ( - )  chills , ( - ) night sweats Eyes: ( - ) blurriness of vision, ( - ) double vision, ( - ) watery eyes Ears, nose, mouth, throat, and face: ( - ) mucositis, ( - ) sore throat Respiratory: ( - ) cough, ( - ) dyspnea, ( - ) wheezes Cardiovascular: ( - ) palpitation, ( - ) chest discomfort, ( +) lower extremity swelling Gastrointestinal:  ( - ) nausea, ( - ) heartburn, ( - ) change in bowel habits Skin: ( - ) abnormal skin rashes Lymphatics: ( - ) new lymphadenopathy, ( - ) easy bruising Neurological: ( - ) numbness, ( - ) tingling, ( - ) new weaknesses Behavioral/Psych: ( - ) mood change, ( - ) new changes  All other systems were reviewed with the patient and are negative.  PHYSICAL EXAMINATION: ECOG PERFORMANCE STATUS: 1 - Symptomatic but completely ambulatory  Vitals:   01/24/23 0910  BP: (!) 136/91  Pulse: 80  Resp: 20  Temp: (!) 97 F (36.1 C)  SpO2: 100%   Filed Weights   01/24/23 0910  Weight: 193 lb (87.5 kg)    GENERAL: well appearing female in NAD  SKIN: skin color, texture, turgor are normal, no rashes or significant lesions EYES: conjunctiva are pink and non-injected, sclera clear OROPHARYNX: no exudate, no erythema; lips, buccal mucosa, and tongue normal  NECK: supple, non-tender LYMPH:  no palpable lymphadenopathy in the cervical or supraclavicular lymph nodes.  LUNGS: clear to auscultation and percussion with normal breathing effort HEART: regular rate & rhythm and no murmurs. +right lower extremity edema, trace Musculoskeletal: no cyanosis of digits and no clubbing  PSYCH: alert & oriented x 3, fluent speech NEURO: no focal motor/sensory deficits  LABORATORY DATA:  I have reviewed the data as listed    Latest Ref  Rng & Units 09/21/2013   10:28 PM 02/14/2012    3:14 PM  CBC  WBC 4.0 - 10.5 K/uL 13.3  8.7   Hemoglobin 12.0 - 15.0 g/dL 16.1  09.6   Hematocrit 36.0 - 46.0 % 39.6  43.6   Platelets 150 - 400 K/uL 230  241        Latest Ref Rng & Units 09/21/2013   10:28 PM 02/14/2012    3:14 PM  CMP  Glucose 70 - 99 mg/dL 045  79   BUN 6 - 23 mg/dL 7  9   Creatinine 4.09 - 1.10 mg/dL 8.11  9.14   Sodium 782 - 145 mEq/L 136  137   Potassium 3.5 - 5.1 mEq/L 3.5  3.7   Chloride 96 - 112 mEq/L 104  101   CO2 19 - 32 mEq/L 19  25   Calcium 8.4 - 10.5 mg/dL 7.8  9.2   Total Protein 6.0 - 8.3 g/dL  6.7   Total Bilirubin 0.3 - 1.2 mg/dL  1.3   Alkaline Phos 39 - 117 U/L  44   AST 0 - 37 U/L  15   ALT 0 - 35 U/L  11    RADIOGRAPHIC STUDIES: I have personally reviewed the radiological images as listed and agreed with the findings in the report. VAS [place] LOWER EXTREMITY VENOUS (DVT)  Result Date: 12/26/2022  Lower Venous DVT Study Patient Name:  Renee Mendez  Date of Exam:   12/26/2022 Medical Rec #: [id number]            Accession #:    [id number] Date of Birth: 03-Oct-1971            Patient Gender: F Patient Age:   52 years Exam Location:  [facility] Procedure:      VAS [place] LOWER EXTREMITY VENOUS (DVT) Referring Phys: RACHEL HAGLER --------------------------------------------------------------------------------  Indications: Right lower extremity pain. Per patient, pain began at medial ankle, extended to knee, now in thigh x1 week.  Limitations: Compression maneuvers contraindicated secondary to proximal findings. Comparison Study: No prior studies. Performing Technologist: Jean Rosenthal RDMS, RVT  Examination Guidelines: A complete evaluation includes B-mode imaging, spectral Doppler, color Doppler, and power Doppler as needed of all accessible portions of each vessel. Bilateral testing is considered an integral part of a complete examination. Limited examinations for reoccurring indications may be  performed as noted. The reflux portion of the exam is performed with the patient in reverse Trendelenburg.  +---------+---------------+---------+-----------+---------------+--------------+ RIGHT    CompressibilityPhasicitySpontaneityProperties     Thrombus Aging +---------+---------------+---------+-----------+---------------+--------------+ CFV      Partial        Yes      Yes        Mobile, looselyAcute                                                      attached                      +---------+---------------+---------+-----------+---------------+--------------+ SFJ      Partial        Yes      Yes                       Acute          +---------+---------------+---------+-----------+---------------+--------------+ FV Prox                 Yes      Yes                                      +---------+---------------+---------+-----------+---------------+--------------+ FV Mid                  Yes      Yes                                      +---------+---------------+---------+-----------+---------------+--------------+ FV Distal               Yes  Yes                                      +---------+---------------+---------+-----------+---------------+--------------+ PFV                     Yes      Yes                                      +---------+---------------+---------+-----------+---------------+--------------+ POP      Full           Yes      Yes                                      +---------+---------------+---------+-----------+---------------+--------------+ PTV      Full           Yes      Yes                                      +---------+---------------+---------+-----------+---------------+--------------+ PERO     Full           Yes      Yes                                      +---------+---------------+---------+-----------+---------------+--------------+ Gastroc  Full                                                              +---------+---------------+---------+-----------+---------------+--------------+   +----+---------------+---------+-----------+----------+--------------+ LEFTCompressibilityPhasicitySpontaneityPropertiesThrombus Aging +----+---------------+---------+-----------+----------+--------------+ CFV Full           Yes      Yes                                 +----+---------------+---------+-----------+----------+--------------+     Summary: RIGHT: - Findings consistent with mobile, acute deep vein thrombosis involving the right common femoral vein, and SF junction. - Findings consistent with acute superficial vein thrombosis involving the right great saphenous vein. - No cystic structure found in the popliteal fossa.  LEFT: - No evidence of common femoral vein obstruction.  *See table(s) above for measurements and observations. Electronically signed by Gerarda Fraction on 12/26/2022 at 4:16:55 PM.    Final     ASSESSMENT & PLAN Renee Mendez is a 52 y.o. female female who presents to the hematology clinic for evaluation of acute DVT involving the right lower extremity.   A provoked venous thromboembolism (VTE) is one that has a clear inciting factor or event. Provoking factors include prolonged travel/immobility, surgery (particular abdominal or orthropedic), trauma,  and pregnancy/ estrogen containing birth control. This patient was reported to be on estrogen birth control, which would qualify as a transient provoking factor. As such we would recommend 3-6 months of anticoagulation therapy with consideration of additional therapy if symptoms persist. The anticoagulation therapy of choice  in this situation is Xarelto. The patient currently has a supply of this medication and is able to afford it without difficult. We will plan to see the patient back in 3 months time to reassess and assure they are doing well on treatment.  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 It is well tolerated. No difficulties accessing/affording the medication --RTC in 3 months' time with strict return precautions for overt signs of bleeding.   #Heavy menstrual bleeding: --Likely secondary to d/c estrogen therapy and starting Xarelto --Will obtain labs today to check iron panel.    Orders Placed This Encounter  Procedures   CBC with Differential (Cancer Center Only)    Standing Status:   Future    Standing Expiration Date:   01/24/2024   CMP (Cancer Center only)    Standing Status:   Future    Standing Expiration Date:   01/24/2024   Ferritin    Standing Status:   Future    Standing Expiration Date:   01/24/2024   Iron and Iron Binding Capacity ([facility] only)    Standing Status:   Future    Standing Expiration Date:   01/24/2024   Vitamin D 25 hydroxy    Standing Status:   Future    Standing Expiration Date:   01/24/2024    All questions were answered. The patient knows to call the clinic with any problems, questions or concerns.  I have spent a total of 60 minutes minutes of face-to-face and non-face-to-face time, preparing to see the patient, obtaining and/or reviewing separately obtained history, performing a medically appropriate examination, counseling and educating the patient, ordering tests/procedures, documenting clinical information in the electronic health record,and care coordination.   Georga Kaufmann, PA-C Department of Hematology/Oncology [facility] at [facility] Phone: [phone]  Patient was seen with Dr. Leonides Schanz.   I have read the above note and personally examined the patient. I agree with the assessment and plan as noted above.  Briefly [name] is a 52 year old female with medical history significant for a provoked lower extremity DVT who presents for evaluation.  At this time her DVT appears to be provoked by estrogen based therapy.  Interestingly she has been on estrogen based therapy for many years and this is the first issue she is encountered.  Is possible there was another mild provoking factor which in combination with the estrogen based therapy caused this DVT.  She has discontinued estrogen-based therapy and is currently on Xarelto 20 mg p.o. daily and tolerating it well.  At this time would recommend a limited duration of anticoagulation for approximately 3 to 6 months.  Will plan to have her back in 3 months in order to assure her symptoms are resolving and will determine at that time if we can discontinue anticoagulation.  The patient voiced understanding of our findings and the plan moving forward.   Ulysees Barns, MD Department of Hematology/Oncology [facility] at [facility] Phone: [phone] Pager: [phone] Email: [email or web address]

## 2023-01-24 ENCOUNTER — Encounter: Payer: Self-pay | Admitting: Physician Assistant

## 2023-01-24 ENCOUNTER — Inpatient Hospital Stay: Payer: No Typology Code available for payment source | Attending: Physician Assistant | Admitting: Physician Assistant

## 2023-01-24 ENCOUNTER — Telehealth: Payer: Self-pay | Admitting: Physician Assistant

## 2023-01-24 ENCOUNTER — Other Ambulatory Visit: Payer: Self-pay

## 2023-01-24 ENCOUNTER — Inpatient Hospital Stay: Payer: No Typology Code available for payment source

## 2023-01-24 VITALS — BP 136/91 | HR 80 | Temp 97.0°F | Resp 20 | Wt 193.0 lb

## 2023-01-24 DIAGNOSIS — E039 Hypothyroidism, unspecified: Secondary | ICD-10-CM

## 2023-01-24 DIAGNOSIS — Z808 Family history of malignant neoplasm of other organs or systems: Secondary | ICD-10-CM | POA: Diagnosis not present

## 2023-01-24 DIAGNOSIS — N920 Excessive and frequent menstruation with regular cycle: Secondary | ICD-10-CM

## 2023-01-24 DIAGNOSIS — I82811 Embolism and thrombosis of superficial veins of right lower extremities: Secondary | ICD-10-CM | POA: Diagnosis not present

## 2023-01-24 DIAGNOSIS — I82451 Acute embolism and thrombosis of right peroneal vein: Secondary | ICD-10-CM

## 2023-01-24 DIAGNOSIS — I82491 Acute embolism and thrombosis of other specified deep vein of right lower extremity: Secondary | ICD-10-CM

## 2023-01-24 DIAGNOSIS — I82441 Acute embolism and thrombosis of right tibial vein: Secondary | ICD-10-CM | POA: Diagnosis not present

## 2023-01-24 DIAGNOSIS — Z7901 Long term (current) use of anticoagulants: Secondary | ICD-10-CM

## 2023-01-24 LAB — CBC WITH DIFFERENTIAL (CANCER CENTER ONLY)
Abs Immature Granulocytes: 0.01 10*3/uL (ref 0.00–0.07)
Basophils Absolute: 0 10*3/uL (ref 0.0–0.1)
Basophils Relative: 1 %
Eosinophils Absolute: 0.1 10*3/uL (ref 0.0–0.5)
Eosinophils Relative: 2 %
HCT: 43.5 % (ref 36.0–46.0)
Hemoglobin: 14.1 g/dL (ref 12.0–15.0)
Immature Granulocytes: 0 %
Lymphocytes Relative: 36 %
Lymphs Abs: 1.8 10*3/uL (ref 0.7–4.0)
MCH: 29.1 pg (ref 26.0–34.0)
MCHC: 32.4 g/dL (ref 30.0–36.0)
MCV: 89.9 fL (ref 80.0–100.0)
Monocytes Absolute: 0.4 10*3/uL (ref 0.1–1.0)
Monocytes Relative: 8 %
Neutro Abs: 2.7 10*3/uL (ref 1.7–7.7)
Neutrophils Relative %: 53 %
Platelet Count: 267 10*3/uL (ref 150–400)
RBC: 4.84 MIL/uL (ref 3.87–5.11)
RDW: 13 % (ref 11.5–15.5)
WBC Count: 5.1 10*3/uL (ref 4.0–10.5)
nRBC: 0 % (ref 0.0–0.2)

## 2023-01-24 LAB — CMP (CANCER CENTER ONLY)
ALT: 38 U/L (ref 0–44)
AST: 28 U/L (ref 15–41)
Albumin: 4.3 g/dL (ref 3.5–5.0)
Alkaline Phosphatase: 70 U/L (ref 38–126)
Anion gap: 5 (ref 5–15)
BUN: 11 mg/dL (ref 6–20)
CO2: 30 mmol/L (ref 22–32)
Calcium: 10.1 mg/dL (ref 8.9–10.3)
Chloride: 103 mmol/L (ref 98–111)
Creatinine: 0.85 mg/dL (ref 0.44–1.00)
GFR, Estimated: >60 mL/min (ref >60)
Glucose, Bld: 97 mg/dL (ref 70–99)
Potassium: 5.1 mmol/L (ref 3.5–5.1)
Sodium: 138 mmol/L (ref 135–145)
Total Bilirubin: 1.4 mg/dL — ABNORMAL HIGH (ref 0.3–1.2)
Total Protein: 7.7 g/dL (ref 6.5–8.1)

## 2023-01-24 LAB — IRON AND IRON BINDING CAPACITY (CC-WL,HP ONLY)
Iron: 97 ug/dL (ref 28–170)
Saturation Ratios: 19 % (ref 10.4–31.8)
TIBC: 500 ug/dL — ABNORMAL HIGH (ref 250–450)
UIBC: 403 ug/dL (ref 148–442)

## 2023-01-24 LAB — VITAMIN D 25 HYDROXY (VIT D DEFICIENCY, FRACTURES): Vit D, 25-Hydroxy: 22.62 ng/mL — ABNORMAL LOW (ref 30–100)

## 2023-01-24 LAB — FERRITIN: Ferritin: 9 ng/mL — ABNORMAL LOW (ref 11–307)

## 2023-01-24 NOTE — Telephone Encounter (Signed)
Reached out to patient per 4/5 LOS, patient aware of date and time of appointment.

## 2023-01-27 ENCOUNTER — Telehealth: Payer: Self-pay

## 2023-01-27 ENCOUNTER — Other Ambulatory Visit: Payer: Self-pay | Admitting: Physician Assistant

## 2023-01-27 MED ORDER — VITAMIN D 25 MCG (1000 UNIT) PO TABS: 1000.0000 [IU] | ORAL_TABLET | Freq: Every day | ORAL | 3 refills | Status: AC

## 2023-01-27 MED ORDER — FERROUS SULFATE 325 (65 FE) MG PO TBEC: 325.0000 mg | DELAYED_RELEASE_TABLET | Freq: Every day | ORAL | 3 refills | Status: AC

## 2023-01-27 NOTE — Telephone Encounter (Signed)
Pt advised with VU and agreed to f/up with with her PCP

## 2023-01-27 NOTE — Telephone Encounter (Signed)
-----   Message from Briant Cedar, PA-C sent at 01/27/2023 12:03 PM EDT ----- Please notify patient that her iron and vitamin D levels are low. I sent a prescription for both supplements. She can follow with her PCP to monitor her levels in the future.   Thanks, Karena Addison  ----- Message ----- From: Leory Plowman, [facility] [place] Sent: 01/24/2023  10:39 AM EDT To: Briant Cedar, PA-C

## 2023-02-28 ENCOUNTER — Ambulatory Visit
Admission: RE | Admit: 2023-02-28 | Discharge: 2023-02-28 | Disposition: A | Discharge status: Home / Self Care | Payer: No Typology Code available for payment source | Source: Ambulatory Visit | Attending: Family Medicine | Admitting: Family Medicine

## 2023-02-28 DIAGNOSIS — Z0000 Encounter for general adult medical examination without abnormal findings: Secondary | ICD-10-CM

## 2023-03-04 ENCOUNTER — Telehealth (HOSPITAL_COMMUNITY): Payer: Self-pay | Admitting: Student-PharmD

## 2023-03-04 NOTE — Telephone Encounter (Signed)
Called patient to follow up and see how she is doing after last DVT Clinic visit in March. Reports she is doing much better. 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 She saw hematology in April who plans for 3-6 months of anticoagulation. She follows up with them in July to determine when she can come off treatment. Given symptom improvement and close follow up planned with hematology, will have the patient follow up with DVT Clinic just as needed. I've provided her with refills to last until her appointment with hematology in July.

## 2023-03-26 ENCOUNTER — Other Ambulatory Visit: Payer: Self-pay | Admitting: Urology

## 2023-03-31 ENCOUNTER — Other Ambulatory Visit: Payer: Self-pay

## 2023-03-31 ENCOUNTER — Encounter (HOSPITAL_BASED_OUTPATIENT_CLINIC_OR_DEPARTMENT_OTHER): Payer: Self-pay | Admitting: Urology

## 2023-03-31 NOTE — Progress Notes (Signed)
Spoke w/ via phone for pre-op interview---pt Lab needs dos----   serum preg poct            Lab results------none COVID test -----patient states asymptomatic no test needed Arrive at -------800 am NPO after MN NO Solid Food.  Clear liquids from MN until---700 am Med rec completed Medications to take morning of surgery -----levothyroxine, citalopram Diabetic medication -----n/a Patient instructed no nail polish to be worn day of surgery Patient instructed to bring photo id and insurance card day of surgery Patient aware to have Driver (ride ) / caregiver  aware will arrange driver  for 24 hours after surgery  Patient Special Instructions -----none Pre-Op special Instructions -----stop xarelto 3 days before surgery per dr pace instructions, pt aware last dose is to be 04-11-2023 Patient verbalized understanding of instructions that were given at this phone interview. Patient denies shortness of breath, chest pain, fever, cough at this phone interview.

## 2023-04-14 NOTE — Progress Notes (Signed)
Called pt regarding arrival time change. Pt reports new onset sinus and ear infection. Pt has left message with [facility]. Will need to reschedule. OR coordinator notified.

## 2023-04-15 ENCOUNTER — Ambulatory Visit (HOSPITAL_BASED_OUTPATIENT_CLINIC_OR_DEPARTMENT_OTHER)
Admission: RE | Admit: 2023-04-15 | Payer: No Typology Code available for payment source | Source: Home / Self Care | Admitting: Urology

## 2023-04-15 DIAGNOSIS — Z01818 Encounter for other preprocedural examination: Secondary | ICD-10-CM

## 2023-04-15 HISTORY — DX: Presence of dental prosthetic device (complete) (partial): Z97.2

## 2023-04-15 HISTORY — DX: Stress incontinence (female) (male): N39.3

## 2023-04-15 HISTORY — DX: Hypothyroidism, unspecified: E03.9

## 2023-04-15 HISTORY — DX: Presence of spectacles and contact lenses: Z97.3

## 2023-04-15 SURGERY — CYSTOSCOPY
Anesthesia: General

## 2023-04-21 ENCOUNTER — Other Ambulatory Visit: Payer: Self-pay | Admitting: Physician Assistant

## 2023-04-21 DIAGNOSIS — I82451 Acute embolism and thrombosis of right peroneal vein: Secondary | ICD-10-CM

## 2023-04-21 DIAGNOSIS — E559 Vitamin D deficiency, unspecified: Secondary | ICD-10-CM

## 2023-04-21 DIAGNOSIS — E611 Iron deficiency: Secondary | ICD-10-CM

## 2023-04-22 ENCOUNTER — Inpatient Hospital Stay: Payer: No Typology Code available for payment source | Admitting: Physician Assistant

## 2023-04-22 ENCOUNTER — Inpatient Hospital Stay: Payer: No Typology Code available for payment source | Attending: Physician Assistant

## 2023-04-22 ENCOUNTER — Other Ambulatory Visit: Payer: Self-pay

## 2023-04-22 VITALS — BP 144/85 | HR 91 | Temp 99.9°F | Resp 17 | Ht 65.0 in | Wt 186.0 lb

## 2023-04-22 DIAGNOSIS — Z86718 Personal history of other venous thrombosis and embolism: Secondary | ICD-10-CM | POA: Insufficient documentation

## 2023-04-22 DIAGNOSIS — Z7901 Long term (current) use of anticoagulants: Secondary | ICD-10-CM | POA: Insufficient documentation

## 2023-04-22 DIAGNOSIS — Z79899 Other long term (current) drug therapy: Secondary | ICD-10-CM | POA: Diagnosis not present

## 2023-04-22 DIAGNOSIS — E559 Vitamin D deficiency, unspecified: Secondary | ICD-10-CM

## 2023-04-22 DIAGNOSIS — N920 Excessive and frequent menstruation with regular cycle: Secondary | ICD-10-CM | POA: Diagnosis not present

## 2023-04-22 DIAGNOSIS — E611 Iron deficiency: Secondary | ICD-10-CM

## 2023-04-22 LAB — CBC WITH DIFFERENTIAL (CANCER CENTER ONLY)
Abs Immature Granulocytes: 0.05 10*3/uL (ref 0.00–0.07)
Basophils Absolute: 0 10*3/uL (ref 0.0–0.1)
Basophils Relative: 0 %
Eosinophils Absolute: 0 10*3/uL (ref 0.0–0.5)
Eosinophils Relative: 0 %
HCT: 42.1 % (ref 36.0–46.0)
Hemoglobin: 14.3 g/dL (ref 12.0–15.0)
Immature Granulocytes: 0 %
Lymphocytes Relative: 12 %
Lymphs Abs: 1.6 10*3/uL (ref 0.7–4.0)
MCH: 30.7 pg (ref 26.0–34.0)
MCHC: 34 g/dL (ref 30.0–36.0)
MCV: 90.3 fL (ref 80.0–100.0)
Monocytes Absolute: 0.3 10*3/uL (ref 0.1–1.0)
Monocytes Relative: 3 %
Neutro Abs: 10.9 10*3/uL — ABNORMAL HIGH (ref 1.7–7.7)
Neutrophils Relative %: 85 %
Platelet Count: 327 10*3/uL (ref 150–400)
RBC: 4.66 MIL/uL (ref 3.87–5.11)
RDW: 12.8 % (ref 11.5–15.5)
WBC Count: 12.9 10*3/uL — ABNORMAL HIGH (ref 4.0–10.5)
nRBC: 0 % (ref 0.0–0.2)

## 2023-04-22 LAB — CMP (CANCER CENTER ONLY)
ALT: 25 U/L (ref 0–44)
AST: 20 U/L (ref 15–41)
Albumin: 3.9 g/dL (ref 3.5–5.0)
Alkaline Phosphatase: 74 U/L (ref 38–126)
Anion gap: 8 (ref 5–15)
BUN: 11 mg/dL (ref 6–20)
CO2: 27 mmol/L (ref 22–32)
Calcium: 9.8 mg/dL (ref 8.9–10.3)
Chloride: 104 mmol/L (ref 98–111)
Creatinine: 0.77 mg/dL (ref 0.44–1.00)
GFR, Estimated: >60 mL/min (ref >60)
Glucose, Bld: 134 mg/dL — ABNORMAL HIGH (ref 70–99)
Potassium: 3.7 mmol/L (ref 3.5–5.1)
Sodium: 139 mmol/L (ref 135–145)
Total Bilirubin: 0.7 mg/dL (ref 0.3–1.2)
Total Protein: 6.8 g/dL (ref 6.5–8.1)

## 2023-04-22 LAB — IRON AND IRON BINDING CAPACITY (CC-WL,HP ONLY)
Iron: 133 ug/dL (ref 28–170)
Saturation Ratios: 34 % — ABNORMAL HIGH (ref 10.4–31.8)
TIBC: 396 ug/dL (ref 250–450)
UIBC: 263 ug/dL (ref 148–442)

## 2023-04-22 LAB — VITAMIN D 25 HYDROXY (VIT D DEFICIENCY, FRACTURES): Vit D, 25-Hydroxy: 35.14 ng/mL (ref 30–100)

## 2023-04-22 LAB — FERRITIN: Ferritin: 52 ng/mL (ref 11–307)

## 2023-04-22 NOTE — Progress Notes (Unsigned)
[facility] [phone]   [phone]  PROGRESS NOTE  Patient Care Team: Deatra James, MD as PCP - General (Family Medicine) Olivia Mackie, MD as Attending Physician (Obstetrics and Gynecology)  Hematological/Oncological History 12/25/2021: Presented with swelling, redness and stinging of right lower extremity. Doppler US showed acute DVT involving right common femoral vein and SF junction. Additional there was acute superficial vein thrombosis involving right great saphenous vein. Started on Xarelto therapy and discontinued estrogen birth control.  01/24/2023: Establish care with [facility] Hematology  CHIEF COMPLAINTS/PURPOSE OF CONSULTATION:  "DVT right lower extremity "  HISTORY OF PRESENTING ILLNESS:  Renee Mendez 52 y.o. female returns for a follow up for history of DVT in the right lower extremity. She is unaccompanied for this visit.   On exam today, Renee Mendez reports that the swelling in her right leg has resolved. She has completed three months of Xarelto therapy. She adds that since discontinuing her estrogen therapy, she had one heavy menstrual cycle but has not had another cycle since then. She is compliant with taking her vitamin D and iron supplements as previously prescribed. She is currently on a prednisone taper for an URI that is improving. She is otherwise feeling well without any concerning symptoms. She denies fevers, chills, sweats, shortness of breath, chest pain, cough, nausea, vomiting, diarrhea or constipation. She has no other complaints. Rest of the 10 point ROS is below.   MEDICAL HISTORY:  Past Medical History:  Diagnosis Date   Allergy    Concussion 2015   no residual   Hypothyroidism    Right leg DVT (HCC) 12/26/2022   SUI (stress urinary incontinence, female)    Wears dentures upper, lower partial plate    Wears glasses     SURGICAL HISTORY: Past Surgical History:  Procedure Laterality Date   Eardrum Surgery      x3, left as child    SOCIAL HISTORY: Social History   Socioeconomic History   Marital status: Single    Spouse name: Not on file   Number of children: 1   Years of education: Not on file   Highest education level: Not on file  Occupational History   Occupation: Receptionist    Employer: [employer]  Tobacco Use   Smoking status: Former   Smokeless tobacco: Never   Tobacco comments:    Quit in her 52s, smoked 3 years.  Vaping Use   Vaping Use: Never used  Substance and Sexual Activity   Alcohol use: Not Currently   Drug use: No   Sexual activity: Not on file  Other Topics Concern   Not on file  Social History Narrative   Lives with boyfriend in 2 story home.  Has one child.     Education: some college.     Works as a Scientist, physiological at [employer].     Social Determinants of Health   Financial Resource Strain: Not on file  Food Insecurity: Not on file  Transportation Needs: Not on file  Physical Activity: Not on file  Stress: Not on file  Social Connections: Not on file  Intimate Partner Violence: Not on file    FAMILY HISTORY: Family History  Problem Relation Age of Onset   Graves' disease Mother    Colon polyps Mother    Hypothyroidism Maternal Grandmother    Deep vein thrombosis Maternal Grandmother    Brain cancer Paternal Grandmother        Tumor   Healthy Son  Migraines Maternal Aunt    Colon cancer Neg Hx    Esophageal cancer Neg Hx    Stomach cancer Neg Hx    Rectal cancer Neg Hx     ALLERGIES:  is allergic to lubiprostone, amoxicillin-pot clavulanate, clindamycin hcl, ketorolac tromethamine, latex, and tapazole [methimazole].  MEDICATIONS:  Current Outpatient Medications  Medication Sig Dispense Refill   acidophilus (RISAQUAD) CAPS capsule Take by mouth daily.     cephALEXin (KEFLEX) 250 MG capsule Take by mouth as needed. After intercourse     cholecalciferol (VITAMIN D3) 25 MCG (1000 UNIT) tablet Take 1 tablet (1,000 Units  total) by mouth daily. 30 tablet 3   citalopram (CELEXA) 40 MG tablet Take 40 mg by mouth daily.     ferrous sulfate 325 (65 FE) MG EC tablet Take 1 tablet (325 mg total) by mouth daily with breakfast. 30 tablet 3   fluticasone (FLONASE) 50 MCG/ACT nasal spray Place 2 sprays into both nostrils daily as needed for rhinitis. 48 g 0   Homeopathic Products (ALLERGY MEDICINE PO) Take by mouth.     levothyroxine (SYNTHROID) 100 MCG tablet Take 100 mcg by mouth daily before breakfast.     nystatin (MYCOSTATIN/NYSTOP) powder Apply 1 Application topically daily as needed.     Phentermine HCl (LOMAIRA) 8 MG TABS Take by mouth.     rivaroxaban (XARELTO) 20 MG TABS tablet Take 1 tablet (20 mg total) by mouth daily with supper. Take with food. 30 tablet 5   No current facility-administered medications for this visit.    REVIEW OF SYSTEMS:   Constitutional: ( - ) fevers, ( - )  chills , ( - ) night sweats Eyes: ( - ) blurriness of vision, ( - ) double vision, ( - ) watery eyes Ears, nose, mouth, throat, and face: ( - ) mucositis, ( - ) sore throat Respiratory: ( - ) cough, ( - ) dyspnea, ( - ) wheezes Cardiovascular: ( - ) palpitation, ( - ) chest discomfort, ( +) lower extremity swelling Gastrointestinal:  ( - ) nausea, ( - ) heartburn, ( - ) change in bowel habits Skin: ( - ) abnormal skin rashes Lymphatics: ( - ) new lymphadenopathy, ( - ) easy bruising Neurological: ( - ) numbness, ( - ) tingling, ( - ) new weaknesses Behavioral/Psych: ( - ) mood change, ( - ) new changes  All other systems were reviewed with the patient and are negative.  PHYSICAL EXAMINATION: ECOG PERFORMANCE STATUS: 0 - Asymptomatic  Vitals:   04/22/23 1303  BP: (!) 144/85  Pulse: 91  Resp: 17  Temp: 99.9 F (37.7 C)  SpO2: 98%   Filed Weights   04/22/23 1303  Weight: 186 lb (84.4 kg)    GENERAL: well appearing female in NAD  SKIN: skin color, texture, turgor are normal, no rashes or significant lesions EYES:  conjunctiva are pink and non-injected, sclera clear LUNGS: clear to auscultation and percussion with normal breathing effort HEART: regular rate & rhythm and no murmurs, no lower extremity edema Musculoskeletal: no cyanosis of digits and no clubbing  PSYCH: alert & oriented x 3, fluent speech NEURO: no focal motor/sensory deficits  LABORATORY DATA:  I have reviewed the data as listed    Latest Ref Rng & Units 04/22/2023   12:46 PM 01/24/2023   10:22 AM 09/21/2013   10:28 PM  CBC  WBC 4.0 - 10.5 K/uL 12.9  5.1  13.3   Hemoglobin 12.0 - 15.0 g/dL 10.2  72.5  13.6   Hematocrit 36.0 - 46.0 % 42.1  43.5  39.6   Platelets 150 - 400 K/uL 327  267  230        Latest Ref Rng & Units 01/24/2023   10:22 AM 09/21/2013   10:28 PM 02/14/2012    3:14 PM  CMP  Glucose 70 - 99 mg/dL 97  409  79   BUN 6 - 20 mg/dL 11  7  9    Creatinine 0.44 - 1.00 mg/dL 8.11  9.14  7.82   Sodium 135 - 145 mmol/L 138  136  137   Potassium 3.5 - 5.1 mmol/L 5.1  3.5  3.7   Chloride 98 - 111 mmol/L 103  104  101   CO2 22 - 32 mmol/L 30  19  25    Calcium 8.9 - 10.3 mg/dL 95.6  7.8  9.2   Total Protein 6.5 - 8.1 g/dL 7.7   6.7   Total Bilirubin 0.3 - 1.2 mg/dL 1.4   1.3   Alkaline Phos 38 - 126 U/L 70   44   AST 15 - 41 U/L 28   15   ALT 0 - 44 U/L 38   11    RADIOGRAPHIC STUDIES: I have personally reviewed the radiological images as listed and agreed with the findings in the report. No results found.  ASSESSMENT & PLAN Renee Mendez is a 52 y.o. female returns for a follow up for history of DVT involving the right lower extremity.   This patient was reported to be on estrogen birth control, which would qualify as a transient provoking factor. As such we would recommend 3-6 months of anticoagulation therapy with consideration of additional therapy if symptoms persist.   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 Patient has no residual symptoms from the DVT and stopped her estrogen birth control. Therefore, she can discontinue anticoagulation.  --Strict precaution for signs and symptoms for developing a new venous thromboembolism.  --RTC as needed.   #Iron deficiency: --Likely secondary to d/c estrogen therapy and starting Xarelto --Currently on ferrous sulfate 325 mg once daily --Iron panel shows improvement with ferritin 52, iron 133, TIBC 396, saturation 34%.  --Patient's PCP will monitor iron levels moving forward.   #Vitamin D deficiency: --currently on oral vitamin D replacement --Vitamin D level has improved to 35.14. Recommend to continue on oral supplementation. --Patient's PCP will monitor vitamin D levels moving forward.   No orders of the defined types were placed in this encounter.   All questions were answered. The patient knows to call the clinic with any problems, questions or concerns.  I have spent a total of 25 minutes minutes of face-to-face and non-face-to-face time, preparing to see the patient, performing a medically appropriate examination, counseling and educating the patient,  documenting clinical information in the electronic health record,and care coordination.   Georga Kaufmann, PA-C Department of Hematology/Oncology [facility] Phone: [phone]

## 2023-04-23 ENCOUNTER — Telehealth: Payer: Self-pay

## 2023-04-23 NOTE — Telephone Encounter (Signed)
Pt advised and agreed to this plan.  *Per Verita Schneiders, RPH Aswagandha and Black Cohosh causes increase risk of bleeding. Myriam Jacobson, can you notify patient that her supplements can increase risk of bleeding  so okay to take pending no signs of bleeding - pt advised with VU

## 2023-04-23 NOTE — Telephone Encounter (Signed)
-----   Message from Briant Cedar, PA-C sent at 04/23/2023  6:37 AM EDT ----- Please notify patient that iron and vitamin d levels have improved. She needs to continue both supplements. Please advise her to monitor her levels with PCP moving forward.

## 2023-04-28 ENCOUNTER — Other Ambulatory Visit: Payer: Self-pay | Admitting: Urology

## 2023-05-21 ENCOUNTER — Ambulatory Visit
Admission: RE | Admit: 2023-05-21 | Discharge: 2023-05-21 | Disposition: A | Discharge status: Home / Self Care | Payer: No Typology Code available for payment source | Source: Ambulatory Visit | Attending: Family Medicine | Admitting: Family Medicine

## 2023-05-21 ENCOUNTER — Other Ambulatory Visit: Payer: Self-pay | Admitting: Family Medicine

## 2023-05-21 DIAGNOSIS — M25511 Pain in right shoulder: Secondary | ICD-10-CM

## 2023-05-21 DIAGNOSIS — M25552 Pain in left hip: Secondary | ICD-10-CM

## 2023-05-25 NOTE — Progress Notes (Signed)
COVID Vaccine received:  []  No [x]  Yes Date of any COVID positive Test in last 90 days:  None  PCP - Deatra James, MD at [facility] [phone] Cardiologist - none Hematology-  Georga Kaufmann, PA-C at [facility]  Chest x-ray -  EKG -  09-21-2013-   No pertinent hx to warrant repeat Stress Test -  ECHO -  Cardiac Cath -   PCR screen: []  Ordered & Completed           []   No Order but Needs PROFEND           [x]   N/A for this surgery  Surgery Plan:  [x]  Ambulatory                            []  Outpatient in bed                            []  Admit  Anesthesia:    [x]  General  []  Spinal                           []   Choice []   MAC  Bowel Prep - [x]  No  []   Yes ______  Pacemaker / ICD device [x]  No []  Yes   Spinal Cord Stimulator:[x]  No []  Yes       History of Sleep Apnea? [x]  No []  Yes   CPAP used?- [x]  No []  Yes    Does the patient monitor blood sugar?          []  No []  Yes  [x]  N/A  Patient has: [x]  NO Hx DM   []  Pre-DM                 []  DM1  []   DM2  Blood Thinner / Instructions: was on Xarelto but stopped per Hematology appt on 04-22-2023 Aspirin Instructions:  none  ERAS Protocol Ordered: []  No  []  Yes Patient is to be NPO after: midnight prior  Comments: Patient is going to have her preop labs (CBC, BMP) drawn at her workplace: [employer]. This is d/t needing them to be sent to [facility] for lower cost.  She will fax these to me tomorrow.    Activity level: Patient is able to climb a flight of stairs without difficulty; [x]  No CP  [x]  No SOB.  Patient can  perform ADLs without assistance.   Anesthesia review: Hx DVT, migraines, anxiety.  No pertinent medical or surgical history.   Patient denies shortness of breath, fever, cough and chest pain at PAT appointment.  Patient verbalized understanding and agreement to the Pre-Surgical Instructions that were given to them at this PAT appointment. Patient was also educated of the need to review these PAT instructions again  prior to her surgery.I reviewed the appropriate phone numbers to call if they have any and questions or concerns.

## 2023-05-25 NOTE — Patient Instructions (Signed)
SURGICAL WAITING ROOM VISITATION Patients having surgery or a procedure may have no more than 2 support people in the waiting area - these visitors may rotate in the visitor waiting room.   Due to an increase in RSV and influenza rates and associated hospitalizations, children ages 68 and under may not visit patients in [facility] hospitals. If the patient needs to stay at the hospital during part of their recovery, the visitor guidelines for inpatient rooms apply.  PRE-OP VISITATION  Pre-op nurse will coordinate an appropriate time for 1 support person to accompany the patient in pre-op.  This support person may not rotate.  This visitor will be contacted when the time is appropriate for the visitor to come back in the pre-op area.  Please refer to the [facility] website for the visitor guidelines for Inpatients (after your surgery is over and you are in a regular room).  You are not required to quarantine at this time prior to your surgery. However, you must do this: Hand Hygiene often Do NOT share personal items Notify your provider if you are in close contact with someone who has COVID or you develop fever 100.4 or greater, new onset of sneezing, cough, sore throat, shortness of breath or body aches.  If you test positive for Covid or have been in contact with anyone that has tested positive in the last 10 days please notify you surgeon.    Your procedure is scheduled on: Tuesday  June 03, 2023   Report to [facility] Main Entrance: [place] entrance where the [employer] is available.   Report to admitting at:  11:00   AM  Call this number if you have any questions or problems the morning of surgery [phone]  DO NOT EAT OR DRINK ANYTHING AFTER MIDNIGHT THE NIGHT PRIOR TO YOUR SURGERY / PROCEDURE.   FOLLOW BOWEL PREP AND ANY ADDITIONAL PRE OP INSTRUCTIONS YOU RECEIVED FROM YOUR SURGEON'S OFFICE!!!   Oral Hygiene is also important to reduce your risk of infection.         Remember - BRUSH YOUR TEETH THE MORNING OF SURGERY WITH YOUR REGULAR TOOTHPASTE  Do NOT smoke after Midnight the night before surgery.  STOP TAKING all Vitamins, Herbs and supplements 1 week before your surgery.   Take ONLY these medicines the morning of surgery with A SIP OF WATER: levothyroxine (Synthroid), loratadine (Claritin), citalopram (Celexa)                    You may not have any metal on your body including hair pins, jewelry, and body piercing  Do not wear make-up, lotions, powders, perfumes or deodorant  Do not wear nail polish including gel and S&S, artificial / acrylic nails, or any other type of covering on natural nails including finger and toenails. If you have artificial nails, gel coating, etc., that needs to be removed by a nail salon, Please have this removed prior to surgery. Not doing so may mean that your surgery could be cancelled or delayed if the Surgeon or anesthesia staff feels like they are unable to monitor you safely.   Do not shave 48 hours prior to surgery to avoid nicks in your skin which may contribute to postoperative infections.   Contacts, Hearing Aids, dentures or bridgework may not be worn into surgery. DENTURES WILL BE REMOVED PRIOR TO SURGERY PLEASE DO NOT APPLY "Poly grip" OR ADHESIVES!!!   Patients discharged on the day of surgery will not be allowed  to drive home.  Someone NEEDS to stay with you for the first 24 hours after anesthesia.  Do not bring your home medications to the hospital. The Pharmacy will dispense medications listed on your medication list to you during your admission in the Hospital.  Special Instructions: Bring a copy of your healthcare power of attorney and living will documents the day of surgery, if you wish to have them scanned into your [facility] Records- EPIC  Please read over the following fact sheets you were given: IF YOU HAVE QUESTIONS ABOUT YOUR PRE-OP INSTRUCTIONS, PLEASE CALL  [phone].   [facility] - Preparing for Surgery Before surgery, you can play an important role.  Because skin is not sterile, your skin needs to be as free of germs as possible.  You can reduce the number of germs on your skin by washing with CHG (chlorahexidine gluconate) soap before surgery.  CHG is an antiseptic cleaner which kills germs and bonds with the skin to continue killing germs even after washing. Please DO NOT use if you have an allergy to CHG or antibacterial soaps.  If your skin becomes reddened/irritated stop using the CHG and inform your nurse when you arrive at Short Stay. Do not shave (including legs and underarms) for at least 48 hours prior to the first CHG shower.  You may shave your face/neck.  Please follow these instructions carefully:  1.  Shower with CHG Soap the night before surgery and the  morning of surgery.  2.  If you choose to wash your hair, wash your hair first as usual with your normal  shampoo.  3.  After you shampoo, rinse your hair and body thoroughly to remove the shampoo.                             4.  Use CHG as you would any other liquid soap.  You can apply chg directly to the skin and wash.  Gently with a scrungie or clean washcloth.  5.  Apply the CHG Soap to your body ONLY FROM THE NECK DOWN.   Do not use on face/ open                           Wound or open sores. Avoid contact with eyes, ears mouth and genitals (private parts).                       Wash face,  Genitals (private parts) with your normal soap.             6.  Wash thoroughly, paying special attention to the area where your  surgery  will be performed.  7.  Thoroughly rinse your body with warm water from the neck down.  8.  DO NOT shower/wash with your normal soap after using and rinsing off the CHG Soap.            9.  Pat yourself dry with a clean towel.            10.  Wear clean pajamas.            11.  Place clean sheets on your bed the night of your first shower and do  not  sleep with pets.  ON THE DAY OF SURGERY : Do not apply any lotions/deodorants the morning of surgery.  Please wear clean clothes to the  hospital/surgery center.    FAILURE TO FOLLOW THESE INSTRUCTIONS MAY RESULT IN THE CANCELLATION OF YOUR SURGERY  PATIENT SIGNATURE_________________________________  NURSE SIGNATURE__________________________________  ________________________________________________________________________

## 2023-05-27 ENCOUNTER — Encounter (HOSPITAL_COMMUNITY)
Admission: RE | Admit: 2023-05-27 | Discharge: 2023-05-27 | Disposition: A | Discharge status: Home / Self Care | Payer: No Typology Code available for payment source | Source: Ambulatory Visit | Attending: Urology | Admitting: Urology

## 2023-05-27 ENCOUNTER — Other Ambulatory Visit: Payer: Self-pay

## 2023-05-27 ENCOUNTER — Encounter (HOSPITAL_COMMUNITY): Payer: Self-pay

## 2023-05-27 VITALS — BP 128/78 | HR 79 | Temp 98.5°F | Resp 18 | Ht 65.0 in | Wt 184.0 lb

## 2023-05-27 DIAGNOSIS — I824Y9 Acute embolism and thrombosis of unspecified deep veins of unspecified proximal lower extremity: Secondary | ICD-10-CM | POA: Insufficient documentation

## 2023-05-27 DIAGNOSIS — Z0000 Encounter for general adult medical examination without abnormal findings: Secondary | ICD-10-CM

## 2023-05-27 DIAGNOSIS — E663 Overweight: Secondary | ICD-10-CM

## 2023-05-27 DIAGNOSIS — Z01818 Encounter for other preprocedural examination: Secondary | ICD-10-CM | POA: Diagnosis present

## 2023-05-27 DIAGNOSIS — Z8669 Personal history of other diseases of the nervous system and sense organs: Secondary | ICD-10-CM

## 2023-05-27 DIAGNOSIS — J310 Chronic rhinitis: Secondary | ICD-10-CM

## 2023-05-27 HISTORY — DX: Headache, unspecified: R51.9

## 2023-05-27 HISTORY — DX: Anxiety disorder, unspecified: F41.9

## 2023-06-01 NOTE — H&P (Signed)
CC/HPI: cc: urinary incontinence   01/30/23: 52 year old woman comes in with 3 to 4-year history of worsening stress urinary continence. She has done pelvic floor physical therapy at [facility] in the past and has not helped her. She has a history of 1 vaginal delivery. She was recently diagnosed with a right lower extremity DVT and is on Xarelto (March 2024). She also struggles with recurrent UTIs. She is perimenopausal but still getting a cycle. Her triggers for UTIs are sexual activity.  Overactive bladder validate question screener: 3, 2, 1, 5, 2, not answered, 1, 2=16/40   03/21/23: Here for cystoscopy and pelvic exam     ALLERGIES: Amitiza Augmentin Clindamycin Ketorolac Tromethamine SOLN Tapazole TABS    MEDICATIONS: Levothyroxine Sodium 100 mcg tablet  Allergy  Cephalexin 250 mg capsule 1 capsule PO Daily take 1 capsule after sexual activity to prevent UTI  Citalopram Hbr 40 mg tablet 1 tablet PO Daily  Iron  Vitamin D3 25 mcg (1,000 unit) capsule  Xarelto     GU PSH: No GU PSH    NON-GU PSH: Repair Eardrum     GU PMH: Chronic cystitis (w/o hematuria) - 01/30/2023 Stress Incontinence - 01/30/2023    NON-GU PMH: Anxiety DVT, History Hypothyroidism    FAMILY HISTORY: Prostate Cancer - Brother   SOCIAL HISTORY: No Social History    REVIEW OF SYSTEMS:    GU Review Female:   Patient denies frequent urination, hard to postpone urination, burning /pain with urination, get up at night to urinate, leakage of urine, stream starts and stops, trouble starting your stream, have to strain to urinate, and being pregnant.  Gastrointestinal (Upper):   Patient denies nausea, vomiting, and indigestion/ heartburn.  Gastrointestinal (Lower):   Patient denies diarrhea and constipation.  Constitutional:   Patient denies fever, night sweats, weight loss, and fatigue.  Skin:   Patient denies skin rash/ lesion and itching.  Eyes:   Patient denies blurred vision and double vision.   Ears/ Nose/ Throat:   Patient denies sore throat and sinus problems.  Hematologic/Lymphatic:   Patient denies swollen glands and easy bruising.  Cardiovascular:   Patient denies leg swelling and chest pains.  Respiratory:   Patient denies cough and shortness of breath.  Endocrine:   Patient denies excessive thirst.  Musculoskeletal:   Patient denies joint pain and back pain.  Neurological:   Patient denies headaches and dizziness.  Psychologic:   Patient denies depression and anxiety.   VITAL SIGNS:      03/21/2023 08:10 AM  Weight 186 lb / 84.37 kg  BP 148/90 mmHg  Pulse 69 /min   GU PHYSICAL EXAMINATION:    External Genitalia: No hirsutism, no rash, no scarring, no cyst, no erythematous lesion, no papular lesion, no blanched lesion, no warty lesion. No edema.  Urethral Meatus: Normal size. Normal position. No discharge.  Urethra: No tenderness, no mass, no scarring. No hypermobility. No leakage.  Bladder: Normal to palpation, no tenderness, no mass, normal size.  Vagina: No atrophy, no stenosis. No rectocele. No cystocele. No enterocele.   MULTI-SYSTEM PHYSICAL EXAMINATION:    Constitutional: Well-nourished. No physical deformities. Normally developed. Good grooming.  Neck: Neck symmetrical, not swollen. Normal tracheal position.  Respiratory: No labored breathing, no use of accessory muscles.   Skin: No paleness, no jaundice, no cyanosis. No lesion, no ulcer, no rash.  Neurologic / Psychiatric: Oriented to time, oriented to place, oriented to person. No depression, no anxiety, no agitation.  Eyes: Normal conjunctivae. Normal eyelids.  Ears, Nose, Mouth, and Throat: Left ear no scars, no lesions, no masses. Right ear no scars, no lesions, no masses. Nose no scars, no lesions, no masses. Normal hearing. Normal lips.  Musculoskeletal: Normal gait and station of head and neck.     Complexity of Data:  Records Review:   Previous Patient Records, POC Tool  Urine Test Review:    Urinalysis   PROCEDURES:         Flexible Cystoscopy - 52000  Risks, benefits, and some of the potential complications of the procedure were discussed at length with the patient including infection, bleeding, voiding discomfort, urinary retention, fever, chills, sepsis, and others. All questions were answered. Informed consent was obtained. Antibiotic prophylaxis was given. Sterile technique and intraurethral analgesia were used.  Meatus:  Normal size. Normal location. Normal condition.  Urethra:  +leakage with cough, no hypermobility  Ureteral Orifices:  Normal location. Normal size. Normal shape. Effluxed clear urine.  Bladder:  No trabeculation. No tumors. Normal mucosa. No stones.      The lower urinary tract was carefully examined. The procedure was well-tolerated and without complications. Antibiotic instructions were given. Instructions were given to call the office immediately for bloody urine, difficulty urinating, urinary retention, painful or frequent urination, fever, chills, nausea, vomiting or other illness. The patient stated that she understood these instructions and would comply with them.         Urinalysis w/Scope - 81001 Dipstick Dipstick Cont'd Micro  Color: Yellow Bilirubin: Neg WBC/hpf: 6 - 10/hpf  Appearance: Slightly Cloudy Ketones: Trace RBC/hpf: NS (Not Seen)  Specific Gravity: 1.025 Blood: Neg Bacteria: Rare (0-9/hpf)  pH: 5.5 Protein: Trace Cystals: NS (Not Seen)  Glucose: Neg Urobilinogen: 0.2 Casts: NS (Not Seen)    Nitrites: Neg Trichomonas: Not Present    Leukocyte Esterase: 2+ Mucous: Not Present      Epithelial Cells: 0 - 5/hpf      Yeast: NS (Not Seen)      Sperm: Not Present    Notes:  Micro performed on unspun urine     ASSESSMENT:      ICD-10 Details  1 GU:   Stress Incontinence - N39.3 Chronic, Stable  2   Chronic cystitis (w/o hematuria) - N30.20 Chronic, Stable   PLAN:           Document Letter(s):  Created for Patient: Clinical  Summary         Notes:   Stress urinary incontinence: Cystoscopy and pelvic exam confirm stress urinary incontinence with no significant pelvic organ prolapse. We discussed management options including PT, Bulkamid and mid urethral sling. Patient wishes to proceed with mid urethral sling. Risks and benefits were discussed in detail including but not limited to pain, bleeding, infection, damage to surrounding structures, risk of mesh infection/erosion/extrusion, urinary retention, need for Foley catheter, need for future procedures to incise or release mesh or remove mesh.

## 2023-06-02 ENCOUNTER — Ambulatory Visit (INDEPENDENT_AMBULATORY_CARE_PROVIDER_SITE_OTHER): Payer: No Typology Code available for payment source | Admitting: Otolaryngology

## 2023-06-02 ENCOUNTER — Encounter (INDEPENDENT_AMBULATORY_CARE_PROVIDER_SITE_OTHER): Payer: Self-pay | Admitting: Otolaryngology

## 2023-06-02 VITALS — BP 111/79 | HR 85 | Ht 65.0 in | Wt 184.0 lb

## 2023-06-02 DIAGNOSIS — J3089 Other allergic rhinitis: Secondary | ICD-10-CM | POA: Diagnosis not present

## 2023-06-02 DIAGNOSIS — R0982 Postnasal drip: Secondary | ICD-10-CM

## 2023-06-02 DIAGNOSIS — J069 Acute upper respiratory infection, unspecified: Secondary | ICD-10-CM

## 2023-06-02 DIAGNOSIS — R0981 Nasal congestion: Secondary | ICD-10-CM

## 2023-06-02 DIAGNOSIS — H663X2 Other chronic suppurative otitis media, left ear: Secondary | ICD-10-CM | POA: Diagnosis not present

## 2023-06-02 DIAGNOSIS — H6993 Unspecified Eustachian tube disorder, bilateral: Secondary | ICD-10-CM

## 2023-06-02 MED ORDER — AZITHROMYCIN 250 MG PO TABS: ORAL_TABLET | ORAL | 0 refills | Status: DC

## 2023-06-02 MED ORDER — FLUTICASONE PROPIONATE 50 MCG/ACT NA SUSP: 2.0000 | Freq: Every day | NASAL | 6 refills | Status: AC

## 2023-06-02 MED ORDER — METHYLPREDNISOLONE 4 MG PO TBPK: ORAL_TABLET | ORAL | 1 refills | Status: DC

## 2023-06-02 MED ORDER — DESLORATADINE 5 MG PO TABS: 5.0000 mg | ORAL_TABLET | Freq: Every day | ORAL | 3 refills | Status: DC

## 2023-06-02 NOTE — Anesthesia Preprocedure Evaluation (Signed)
Anesthesia Evaluation  Patient identified by MRN, date of birth, ID band Patient awake    Reviewed: Allergy & Precautions, NPO status , Patient's Chart, lab work & pertinent test results  Airway Mallampati: II  TM Distance: >3 FB Neck ROM: Full    Dental no notable dental hx. (+) Upper Dentures, Partial Upper,    Pulmonary former smoker   Pulmonary exam normal breath sounds clear to auscultation       Cardiovascular + DVT  Normal cardiovascular exam Rhythm:Regular Rate:Normal     Neuro/Psych  Headaches  Anxiety        GI/Hepatic negative GI ROS, Neg liver ROS,,,  Endo/Other  Hypothyroidism    Renal/GU Lab Results      Component                Value               Date                           K                        3.7                 04/22/2023               CREATININE               0.77                04/22/2023                     Musculoskeletal   Abdominal   Peds  Hematology   Anesthesia Other Findings All: see list  Reproductive/Obstetrics                             Anesthesia Physical Anesthesia Plan  ASA: 2  Anesthesia Plan: General   Post-op Pain Management: Dilaudid IV and Ofirmev IV (intra-op)*   Induction: Intravenous  PONV Risk Score and Plan: 3 and Treatment may vary due to age or medical condition, Midazolam, Dexamethasone, Ondansetron and Scopolamine patch - Pre-op  Airway Management Planned: LMA  Additional Equipment: None  Intra-op Plan:   Post-operative Plan: Extubation in OR  Informed Consent: I have reviewed the patients History and Physical, chart, labs and discussed the procedure including the risks, benefits and alternatives for the proposed anesthesia with the patient or authorized representative who has indicated his/her understanding and acceptance.     Dental advisory given  Plan Discussed with: CRNA  Anesthesia Plan Comments:          Anesthesia Quick Evaluation

## 2023-06-02 NOTE — Progress Notes (Signed)
ENT CONSULT:  Reason for Consult: Ear infections, hearing changes, URI vs sinus infection  HPI: Renee Mendez is an 52 y.o. female with hx left TM surgery (reconstruction) x 3 times when she was a child, here for initial evaluation following an ear infection and some hearing changes.  In June of 2024 she developed URI symptoms and sinus infection with left ear infection, otorrhea/ear pressure/pain on the left. She was seen by PCP and was given oral abx x 1 week, which did not help. She had nasal congestion and sinus pressure x 4 weeks, but her sx are now better. She gets recurrent sinus infections always involving left side. She had two courses of antibiotics ~ 12 months. No sinus surgeries. She denies heartburn. She is on Claritin as needed and nasal sprays as needed.     Records Reviewed:  Office visit note 04/22/2023 seen in follow-up for acute right DVT Hematological/Oncological History 12/25/2021: Presented with swelling, redness and stinging of right lower extremity. Doppler US showed acute DVT involving right common femoral vein and SF junction. Additional there was acute superficial vein thrombosis involving right great saphenous vein. Started on Xarelto therapy and discontinued estrogen birth control.  01/24/2023: Establish care with [facility]    Past Medical History:  Diagnosis Date   Allergy    Anxiety    Concussion 2015   no residual   Headache    Hypothyroidism    Right leg DVT (HCC) 12/26/2022   SUI (stress urinary incontinence, female)    Wears dentures upper, lower partial plate    Wears glasses     Past Surgical History:  Procedure Laterality Date   Eardrum Surgery     x3, left as child   WISDOM TOOTH EXTRACTION      Family History  Problem Relation Age of Onset   Graves' disease Mother    Colon polyps Mother    Hypothyroidism Maternal Grandmother    Deep vein thrombosis Maternal Grandmother    Brain cancer Paternal Grandmother        Tumor   Healthy  Son    Migraines Maternal Aunt    Colon cancer Neg Hx    Esophageal cancer Neg Hx    Stomach cancer Neg Hx    Rectal cancer Neg Hx     Social History:  reports that she has quit smoking. She has never used smokeless tobacco. She reports that she does not currently use alcohol. She reports that she does not use drugs.  Allergies:  Allergies  Allergen Reactions   Latex     Itching, red    Lubiprostone Shortness Of Breath   Amoxicillin-Pot Clavulanate Other (See Comments)    Dizziness   Clindamycin Hcl Diarrhea   Ketorolac Tromethamine Other (See Comments)    Dizziness, felt sick   Tapazole [Methimazole]     Severe joint pain   Cefuroxime Axetil Rash    Medications: I have reviewed the patient's current medications.  The PMH, PSH, Medications, Allergies, and SH were reviewed and updated.  ROS: Constitutional: Negative for fever, weight loss and weight gain. Cardiovascular: Negative for chest pain and dyspnea on exertion. Respiratory: Is not experiencing shortness of breath at rest. Gastrointestinal: Negative for nausea and vomiting. Neurological: Negative for headaches. Psychiatric: The patient is not nervous/anxious  Blood pressure 111/79, pulse 85, height 5\' 5"  (1.651 m), weight 184 lb (83.5 kg), last menstrual period 01/14/2023, SpO2 100%.  PHYSICAL EXAM:  Exam: General: Well-developed, well-nourished Communication and Voice: Clear pitch and  clarity Respiratory Respiratory effort: Equal inspiration and expiration without stridor Cardiovascular Peripheral Vascular: Warm extremities with equal color/perfusion Eyes: No nystagmus with equal extraocular motion bilaterally Neuro/Psych/Balance: Patient oriented to person, place, and time; Appropriate mood and affect; Gait is intact with no imbalance; Cranial nerves I-XII are intact Head and Face Inspection: Normocephalic and atraumatic without mass or lesion Palpation: Facial skeleton intact without bony  stepoffs Salivary Glands: No mass or tenderness Facial Strength: Facial motility symmetric and full bilaterally ENT Pinna: External ear intact and fully developed External canal: Canal is patent with intact skin Tympanic Membrane: Clear and mobile on the right, small amount of yellow secretions suctioned on the left side in the area of pars flaccida, left TM is thickened, with retraction, no obvious TM perforation noted on the left, overall EAC appears dry.  External Nose: No scar or anatomic deformity Internal Nose: Septum is deviated to the left. No polyp, or purulence. Mucosal edema and erythema present.  Bilateral inferior turbinate hypertrophy.  Lips, Teeth, and gums: Mucosa and teeth intact and viable TMJ: No pain to palpation with full mobility Oral cavity/oropharynx: No erythema or exudate, no lesions present Neck Neck and Trachea: Midline trachea without mass or lesion Thyroid: No mass or nodularity Lymphatics: No lymphadenopathy  Procedure:   PROCEDURE NOTE: nasal endoscopy  Preoperative diagnosis: chronic sinusitis symptoms  Postoperative diagnosis: same  Procedure: Diagnostic nasal endoscopy (29562)  Surgeon: Ashok Croon, M.D.  Anesthesia: Topical lidocaine and Afrin  H&P REVIEW: The patient's history and physical were reviewed today prior to procedure. All medications were reviewed and updated as well. Complications: None Condition is stable throughout exam Indications and consent: The patient presents with symptoms of chronic sinusitis not responding to previous therapies. All the risks, benefits, and potential complications were reviewed with the patient preoperatively and informed consent was obtained. The time out was completed with confirmation of the correct procedure.   Procedure: The patient was seated upright in the clinic. Topical lidocaine and Afrin were applied to the nasal cavity. After adequate anesthesia had occurred, the rigid nasal endoscope was  passed into the nasal cavity. The nasal mucosa, turbinates, septum, and sinus drainage pathways were visualized bilaterally. This revealed no purulence or significant secretions that might be cultured. There were no polyps or sites of significant inflammation. The mucosa was intact and there was no crusting present. The scope was then slowly withdrawn and the patient tolerated the procedure well. There were no complications or blood loss.  Studies Reviewed: None  Assessment/Plan: Encounter Diagnoses  Name Primary?   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  Received oral antibiotic for it and feels symptoms are improving.  She had nasal congestion and facial pain and pressure with her URI symptoms as well.  She reports muffled hearing on the left side since the episode which was 2 months ago, no recent audiograms.  On exam there is evidence of thickened TM on the left side and small  amount of purulent drainage in the retraction pocket along pars flaccida, no obvious TM perforation seen on the left side, right TM and EAC appear clear without edema or drainage.  Nasal endoscopy portion of the exam revealed no purulence or polyps, but showed evidence of inferior turbinate hypertrophy clear secretions in the nasopharynx and mild left-sided nasal septal deviation.  Will do a trial of Medrol Dosepak and Z-Pak if her symptoms persist and continue or recur in the future.  Rx sent  to her pharmacy.  I advised the patient to initiate daily antihistamine and nasal steroid spray such as Flonase.  We will obtain screening audiogram to ensure we have baseline for her hearing due to history of multiple ear surgeries in the past.  Will also obtain CT temporal bone to rule out cholesteatoma.  She will return after testing.  - take Clarinex daily and Flonase twice daily  - schedule CT Temporal bone  - Audiogram   - return after testing  Thank you for allowing me to participate in the care of this patient. Please do not hesitate to contact me with any questions or concerns.   Ashok Croon, MD Otolaryngology [facility] Phone: [phone] Fax: [phone]    06/02/2023, 6:50 PM

## 2023-06-02 NOTE — Patient Instructions (Signed)
-   take Clarinex daily and Flonase twice daily  - schedule CT Temporal bone  - Audiogram  - return after testing

## 2023-06-03 ENCOUNTER — Other Ambulatory Visit: Payer: Self-pay

## 2023-06-03 ENCOUNTER — Ambulatory Visit (HOSPITAL_COMMUNITY): Payer: No Typology Code available for payment source | Admitting: Anesthesiology

## 2023-06-03 ENCOUNTER — Ambulatory Visit (HOSPITAL_COMMUNITY)
Admission: RE | Admit: 2023-06-03 | Discharge: 2023-06-03 | Disposition: A | Discharge status: Home / Self Care | Payer: No Typology Code available for payment source | Attending: Urology | Admitting: Urology

## 2023-06-03 ENCOUNTER — Encounter (HOSPITAL_COMMUNITY): Payer: Self-pay | Admitting: Urology

## 2023-06-03 ENCOUNTER — Ambulatory Visit (HOSPITAL_BASED_OUTPATIENT_CLINIC_OR_DEPARTMENT_OTHER): Payer: No Typology Code available for payment source | Admitting: Anesthesiology

## 2023-06-03 ENCOUNTER — Encounter (HOSPITAL_COMMUNITY)
Admission: RE | Disposition: A | Discharge status: Home / Self Care | Payer: Self-pay | Source: Home / Self Care | Attending: Urology

## 2023-06-03 DIAGNOSIS — E039 Hypothyroidism, unspecified: Secondary | ICD-10-CM | POA: Insufficient documentation

## 2023-06-03 DIAGNOSIS — N393 Stress incontinence (female) (male): Secondary | ICD-10-CM

## 2023-06-03 DIAGNOSIS — F419 Anxiety disorder, unspecified: Secondary | ICD-10-CM | POA: Diagnosis not present

## 2023-06-03 DIAGNOSIS — Z87440 Personal history of urinary (tract) infections: Secondary | ICD-10-CM | POA: Diagnosis not present

## 2023-06-03 DIAGNOSIS — N3020 Other chronic cystitis without hematuria: Secondary | ICD-10-CM | POA: Diagnosis not present

## 2023-06-03 DIAGNOSIS — Z87891 Personal history of nicotine dependence: Secondary | ICD-10-CM | POA: Diagnosis not present

## 2023-06-03 DIAGNOSIS — Z7901 Long term (current) use of anticoagulants: Secondary | ICD-10-CM | POA: Diagnosis not present

## 2023-06-03 DIAGNOSIS — Z01818 Encounter for other preprocedural examination: Secondary | ICD-10-CM

## 2023-06-03 DIAGNOSIS — Z86718 Personal history of other venous thrombosis and embolism: Secondary | ICD-10-CM | POA: Diagnosis not present

## 2023-06-03 HISTORY — PX: CYSTOSCOPY: SHX5120

## 2023-06-03 HISTORY — PX: PUBOVAGINAL SLING: SHX1035

## 2023-06-03 LAB — POCT PREGNANCY, URINE: Preg Test, Ur: NEGATIVE

## 2023-06-03 SURGERY — CYSTOSCOPY
Anesthesia: General

## 2023-06-03 MED ORDER — HYDROMORPHONE HCL 1 MG/ML IJ SOLN
INTRAMUSCULAR | Status: DC | PRN
Start: 2023-06-03 — End: 2023-06-03
  Administered 2023-06-03 (×4): .5 mg via INTRAVENOUS

## 2023-06-03 MED ORDER — ONDANSETRON HCL 4 MG/2ML IJ SOLN
INTRAMUSCULAR | Status: DC | PRN
  Administered 2023-06-03: 4 mg via INTRAVENOUS

## 2023-06-03 MED ORDER — FENTANYL CITRATE (PF) 100 MCG/2ML IJ SOLN
INTRAMUSCULAR | Status: DC | PRN
  Administered 2023-06-03: 50 ug via INTRAVENOUS
  Administered 2023-06-03: 100 ug via INTRAVENOUS
  Administered 2023-06-03: 50 ug via INTRAVENOUS

## 2023-06-03 MED ORDER — ACETAMINOPHEN 500 MG PO TABS
1000.0000 mg | ORAL_TABLET | Freq: Once | ORAL | Status: AC
  Administered 2023-06-03: 1000 mg via ORAL
  Filled 2023-06-03: qty 2

## 2023-06-03 MED ORDER — MIDAZOLAM HCL 2 MG/2ML IJ SOLN
INTRAMUSCULAR | Status: AC
  Filled 2023-06-03: qty 2

## 2023-06-03 MED ORDER — PHENYLEPHRINE HCL (PRESSORS) 10 MG/ML IV SOLN
INTRAVENOUS | Status: DC | PRN
  Administered 2023-06-03 (×2): 80 ug via INTRAVENOUS

## 2023-06-03 MED ORDER — DEXAMETHASONE SODIUM PHOSPHATE 10 MG/ML IJ SOLN
INTRAMUSCULAR | Status: AC
  Filled 2023-06-03: qty 1

## 2023-06-03 MED ORDER — CHLORHEXIDINE GLUCONATE 0.12 % MT SOLN
15.0000 mL | Freq: Once | OROMUCOSAL | Status: AC
  Administered 2023-06-03: 15 mL via OROMUCOSAL

## 2023-06-03 MED ORDER — ONDANSETRON HCL 4 MG/2ML IJ SOLN: 4.0000 mg | Freq: Once | INTRAMUSCULAR | Status: DC | PRN

## 2023-06-03 MED ORDER — SCOPOLAMINE 1 MG/3DAYS TD PT72
1.0000 | MEDICATED_PATCH | TRANSDERMAL | Status: DC
  Administered 2023-06-03: 1.5 mg via TRANSDERMAL
  Filled 2023-06-03: qty 1

## 2023-06-03 MED ORDER — ACETAMINOPHEN 10 MG/ML IV SOLN: 1000.0000 mg | Freq: Once | INTRAVENOUS | Status: DC | PRN

## 2023-06-03 MED ORDER — SODIUM CHLORIDE 0.9 % IR SOLN
Status: DC | PRN
Start: 2023-06-03 — End: 2023-06-04
  Administered 2023-06-03: 3000 mL via INTRAVESICAL

## 2023-06-03 MED ORDER — AMISULPRIDE (ANTIEMETIC) 5 MG/2ML IV SOLN: 10.0000 mg | Freq: Once | INTRAVENOUS | Status: DC | PRN

## 2023-06-03 MED ORDER — LIDOCAINE-EPINEPHRINE (PF) 1 %-1:200000 IJ SOLN
INTRAMUSCULAR | Status: AC
  Filled 2023-06-03: qty 30

## 2023-06-03 MED ORDER — PROPOFOL 10 MG/ML IV BOLUS
INTRAVENOUS | Status: AC
  Filled 2023-06-03: qty 20

## 2023-06-03 MED ORDER — LIDOCAINE HCL (PF) 2 % IJ SOLN
INTRAMUSCULAR | Status: AC
  Filled 2023-06-03: qty 5

## 2023-06-03 MED ORDER — LIDOCAINE HCL (CARDIAC) PF 100 MG/5ML IV SOSY
PREFILLED_SYRINGE | INTRAVENOUS | Status: DC | PRN
  Administered 2023-06-03: 80 mg via INTRAVENOUS

## 2023-06-03 MED ORDER — FENTANYL CITRATE (PF) 100 MCG/2ML IJ SOLN
INTRAMUSCULAR | Status: AC
  Filled 2023-06-03: qty 2

## 2023-06-03 MED ORDER — CIPROFLOXACIN IN D5W 400 MG/200ML IV SOLN
400.0000 mg | INTRAVENOUS | Status: AC
  Administered 2023-06-03: 400 mg via INTRAVENOUS
  Filled 2023-06-03: qty 200

## 2023-06-03 MED ORDER — OXYCODONE HCL 5 MG PO TABS: 5.0000 mg | ORAL_TABLET | Freq: Once | ORAL | Status: DC | PRN

## 2023-06-03 MED ORDER — PHENYLEPHRINE 80 MCG/ML (10ML) SYRINGE FOR IV PUSH (FOR BLOOD PRESSURE SUPPORT)
PREFILLED_SYRINGE | INTRAVENOUS | Status: AC
  Filled 2023-06-03: qty 10

## 2023-06-03 MED ORDER — ONDANSETRON HCL 4 MG/2ML IJ SOLN
INTRAMUSCULAR | Status: AC
  Filled 2023-06-03: qty 2

## 2023-06-03 MED ORDER — DEXAMETHASONE SODIUM PHOSPHATE 4 MG/ML IJ SOLN
INTRAMUSCULAR | Status: DC | PRN
  Administered 2023-06-03: 5 mg via INTRAVENOUS

## 2023-06-03 MED ORDER — ESTRADIOL 0.1 MG/GM VA CREA
TOPICAL_CREAM | VAGINAL | Status: AC
  Filled 2023-06-03: qty 42.5

## 2023-06-03 MED ORDER — OXYCODONE HCL 5 MG/5ML PO SOLN: 5.0000 mg | Freq: Once | ORAL | Status: DC | PRN

## 2023-06-03 MED ORDER — CIPROFLOXACIN HCL 250 MG PO TABS: 250.0000 mg | ORAL_TABLET | Freq: Every day | ORAL | 0 refills | Status: AC

## 2023-06-03 MED ORDER — STERILE WATER FOR IRRIGATION IR SOLN
Status: DC | PRN
  Administered 2023-06-03: 500 mL

## 2023-06-03 MED ORDER — ESTRADIOL 0.1 MG/GM VA CREA
TOPICAL_CREAM | VAGINAL | Status: DC | PRN
Start: 2023-06-03 — End: 2023-06-04
  Administered 2023-06-03: 1 via VAGINAL

## 2023-06-03 MED ORDER — LACTATED RINGERS IV SOLN
INTRAVENOUS | Status: DC
  Administered 2023-06-03: 1000 mL via INTRAVENOUS

## 2023-06-03 MED ORDER — TRAMADOL HCL 50 MG PO TABS: 50.0000 mg | ORAL_TABLET | Freq: Four times a day (QID) | ORAL | 0 refills | Status: AC | PRN

## 2023-06-03 MED ORDER — LIDOCAINE-EPINEPHRINE (PF) 1 %-1:200000 IJ SOLN
INTRAMUSCULAR | Status: DC | PRN
  Administered 2023-06-03: 30 mL

## 2023-06-03 MED ORDER — ORAL CARE MOUTH RINSE: 15.0000 mL | Freq: Once | OROMUCOSAL | Status: AC

## 2023-06-03 MED ORDER — MIDAZOLAM HCL 5 MG/5ML IJ SOLN
INTRAMUSCULAR | Status: DC | PRN
  Administered 2023-06-03: 2 mg via INTRAVENOUS

## 2023-06-03 MED ORDER — HYDROMORPHONE HCL 2 MG/ML IJ SOLN
INTRAMUSCULAR | Status: AC
  Filled 2023-06-03: qty 1

## 2023-06-03 MED ORDER — HYDROMORPHONE HCL 1 MG/ML IJ SOLN: 0.2500 mg | INTRAMUSCULAR | Status: DC | PRN

## 2023-06-03 MED ORDER — PROPOFOL 10 MG/ML IV BOLUS
INTRAVENOUS | Status: DC | PRN
Start: 2023-06-03 — End: 2023-06-03
  Administered 2023-06-03: 200 mg via INTRAVENOUS

## 2023-06-03 SURGICAL SUPPLY — 68 items
ADH SKN CLS APL DERMABOND .7 (GAUZE/BANDAGES/DRESSINGS) ×1
BAG COUNTER SPONGE SURGICOUNT (BAG) IMPLANT
BAG DECANTER FOR FLEXI CONT (MISCELLANEOUS) ×1 IMPLANT
BAG DRN RND TRDRP ANRFLXCHMBR (UROLOGICAL SUPPLIES) ×1
BAG SPNG CNTER NS LX DISP (BAG)
BAG URINE DRAIN 2000ML AR STRL (UROLOGICAL SUPPLIES) IMPLANT
BAG URO CATCHER STRL LF (MISCELLANEOUS) ×1 IMPLANT
BLADE SURG 15 STRL LF DISP TIS (BLADE) ×1 IMPLANT
BLADE SURG 15 STRL SS (BLADE) ×1
BRIEF MESH DISP LRG (UNDERPADS AND DIAPERS) ×1 IMPLANT
CATH FOLEY 2WAY SLVR 5CC 14FR (CATHETERS) ×1 IMPLANT
CATH URETL OPEN END 6FR 70 (CATHETERS) ×1 IMPLANT
CLOTH BEACON ORANGE TIMEOUT ST (SAFETY) ×1 IMPLANT
COVER MAYO STAND STRL (DRAPES) ×1 IMPLANT
COVER SURGICAL LIGHT HANDLE (MISCELLANEOUS) ×1 IMPLANT
DERMABOND ADVANCED .7 DNX12 (GAUZE/BANDAGES/DRESSINGS) IMPLANT
DEVICE CAPIO SLIM SINGLE (INSTRUMENTS) IMPLANT
DRAIN PENROSE 0.25X18 (DRAIN) ×1 IMPLANT
DRAPE UNDERBUTTOCKS STRL (DISPOSABLE) ×1 IMPLANT
ELECT PENCIL ROCKER SW 15FT (MISCELLANEOUS) ×1 IMPLANT
GAUZE 4X4 16PLY ~~LOC~~+RFID DBL (SPONGE) ×2 IMPLANT
GAUZE STRIP PACKING 2INX5YD (MISCELLANEOUS) IMPLANT
GLOVE BIO SURGEON STRL SZ 6.5 (GLOVE) ×1 IMPLANT
GLOVE ECLIPSE 8.5 STRL (GLOVE) ×1 IMPLANT
GLOVE SURG LX STRL 7.5 STRW (GLOVE) ×1 IMPLANT
GOWN STRL REUS W/ TWL LRG LVL3 (GOWN DISPOSABLE) ×1 IMPLANT
GOWN STRL REUS W/ TWL XL LVL3 (GOWN DISPOSABLE) ×1 IMPLANT
GOWN STRL REUS W/TWL LRG LVL3 (GOWN DISPOSABLE) ×1
GOWN STRL REUS W/TWL XL LVL3 (GOWN DISPOSABLE) ×1
HOLDER FOLEY CATH W/STRAP (MISCELLANEOUS) ×1 IMPLANT
IV NS 1000ML (IV SOLUTION)
IV NS 1000ML BAXH (IV SOLUTION) ×1 IMPLANT
KIT BASIN OR (CUSTOM PROCEDURE TRAY) ×1 IMPLANT
KIT TURNOVER KIT A (KITS) IMPLANT
MANIFOLD NEPTUNE II (INSTRUMENTS) ×1 IMPLANT
NDL HYPO 22X1.5 SAFETY MO (MISCELLANEOUS) ×1 IMPLANT
NDL MAYO 6 CRC TAPER PT (NEEDLE) ×1 IMPLANT
NEEDLE HYPO 22X1.5 SAFETY MO (MISCELLANEOUS) ×1
NEEDLE MAYO 6 CRC TAPER PT (NEEDLE)
NS IRRIG 1000ML POUR BTL (IV SOLUTION) ×1 IMPLANT
PACK CYSTO (CUSTOM PROCEDURE TRAY) ×1 IMPLANT
PACKING VAGINAL (PACKING) IMPLANT
PAD OB MATERNITY 4.3X12.25 (PERSONAL CARE ITEMS) ×1 IMPLANT
PLUG CATH AND CAP STRL 200 (CATHETERS) ×1 IMPLANT
PROTECTOR NERVE ULNAR (MISCELLANEOUS) ×1 IMPLANT
RETRACTOR STAY HOOK 5MM (MISCELLANEOUS) ×1 IMPLANT
SHEET LAVH (DRAPES) ×1 IMPLANT
SLING LYNX SUPRAPUBIC (Sling) IMPLANT
SPIKE FLUID TRANSFER (MISCELLANEOUS) ×1 IMPLANT
SUT ABS MONO DBL WITH NDL 48IN (SUTURE) IMPLANT
SUT CAPIO ETHIBPND (SUTURE) IMPLANT
SUT SILK 2 0 30 PSL (SUTURE) IMPLANT
SUT VIC AB 0 CT1 27 (SUTURE)
SUT VIC AB 0 CT1 27XBRD ANTBC (SUTURE) ×1 IMPLANT
SUT VIC AB 2-0 CT1 27 (SUTURE)
SUT VIC AB 2-0 CT1 27XBRD (SUTURE) ×2 IMPLANT
SUT VIC AB 2-0 SH 27 (SUTURE) ×1
SUT VIC AB 2-0 SH 27X BRD (SUTURE) ×2 IMPLANT
SUT VIC AB 3-0 SH 27 (SUTURE) ×1
SUT VIC AB 3-0 SH 27XBRD (SUTURE) ×2 IMPLANT
SUT VICRYL 0 UR6 27IN ABS (SUTURE) ×2 IMPLANT
SYR 10ML LL (SYRINGE) ×1 IMPLANT
TOWEL OR 17X26 10 PK STRL BLUE (TOWEL DISPOSABLE) ×1 IMPLANT
TOWEL OR NON WOVEN STRL DISP B (DISPOSABLE) ×1 IMPLANT
TUBING CONNECTING 10 (TUBING) ×2 IMPLANT
TUBING UROLOGY SET (TUBING) IMPLANT
UNDERPAD 30X36 HEAVY ABSORB (UNDERPADS AND DIAPERS) ×1 IMPLANT
WATER STERILE IRR 1000ML POUR (IV SOLUTION) ×1 IMPLANT

## 2023-06-03 NOTE — Op Note (Signed)
Operative Note  Preoperative diagnosis:  1.  Stress urinary incontinence  Postoperative diagnosis: 1.  Stress urinary incontinence  Procedure(s): 1.  Cystoscopy and mid-urethral sling placement [facility] [place])  Surgeon: Kasandra Knudsen, MD  Assistants:  None  Anesthesia:  General  Complications:  None  EBL:  25 mL  Specimens: 1. none  Drains/Catheters: 1.  16Fr foley  Intraoperative findings:   Normal urethra Bilateral orthotopic Uos  Normal bladder mucosa and no foreign body in bladder after trocars passed  Indication:  Renee Mendez is a 52 y.o. female with symptomatic stress urinary incontinence.   Description of procedure: After risks, benefits and alternatives of procedure discussed with patient in detail, informed consent was obtained.  The patient was taken to the operating placed in supine position.  Anesthesia induced antibiotics were administered.  Patient was then placed in the dorsolithotomy position.  She was prepped and draped in the usual sterile fashion and a time was performed.  A 16 French Foley catheter was then placed in the patient's urethra and the bladder was drained. The Foley catheter was then capped.  Skin hooks were placed in the corners of the labia minora to open up the vaginal vestibule as well as a weight speculum. 20 cc of quarter percent Marcaine with 1% epinephrine was then injected into the periurethral tissue. The suprapubic incisions were then marked out 1 cm lateral to midline and 1 cm above the pubic bone. Using a 15 blade a stab incision was made in both sides of midline. Gauze is placed over these areas to control the skin bleeding. A 1.5 cm incision was then made in the mid urethra through the vaginal mucosa.  Dissection was then carried out laterally on both sides to the endopelvic fascia. The dissection was completed once there was enough space to place a finger through the incision on both sides of the urethra. Using the  [facility] needle set both needles were passed through the stab incisions suprapubically down posterior to the pubic bone and then through the periurethral incisions using the index finger to guide the needle out of the incision. Once both needles had been placed the Foley catheter was removed and cystoscopy was performed. A 360 cystoscopic evaluation was performed. There was no mucosal abnormalities or evidence of perforation from the needles. The cystoscope was then removed and the Foley catheter replaced. The bladder was then drained again and the Foley catheter. The ends of the sling were then attached to the needles and the needles pulled up through the retropubic space and out the suprapubic incision. Once the sling was noted to be centered, the blue plastic cap at the apex of the sling was cut and the plastic sheath was then pulled out. The mesh was noted to be well seated around the urethra. A right angle was used to ensure that the proper amount of tension for the sling around the urethra applied. The sling was noted to be tension free and well positioned. At this point copious amounts of double antibiotic irrigation was then used to irrigate the incision the periurethral space and the vagina. The incision was then closed with 2-0 Vicryl in a running fashion. The stab incisions in the suprapubic area were closed with Dermabond. The vagina was then packed with clindamycin impregnated vaginal packing. The patient was subsequently extubated and returned to the PACU in stable condition.  Disposition: Vaginal packing will be removed in the PACU as well as void trial. She will be  given 3 days of antibiotics as well as pain medications. Followup has been scheduled for 2 weeks.

## 2023-06-03 NOTE — Anesthesia Procedure Notes (Signed)
Procedure Name: LMA Insertion Date/Time: 06/03/2023 3:05 PM  Performed by: Vanessa Galax, CRNAPre-anesthesia Checklist: Emergency Drugs available, Patient identified, Suction available and Patient being monitored Patient Re-evaluated:Patient Re-evaluated prior to induction Oxygen Delivery Method: Circle system utilized Preoxygenation: Pre-oxygenation with 100% oxygen Induction Type: IV induction Ventilation: Mask ventilation without difficulty LMA: LMA inserted LMA Size: 4.0 Number of attempts: 1 Placement Confirmation: positive ETCO2 and breath sounds checked- equal and bilateral Tube secured with: Tape Dental Injury: Teeth and Oropharynx as per pre-operative assessment

## 2023-06-03 NOTE — Discharge Instructions (Addendum)
Post-op Instructions following Mid-urethral Sling surgery   Removal of catheter You will go home with a catheter or tube in your bladder. You will remove this the day after surgery by cutting the tube that sticks off the end of the catheter (the balloon port with numbers written on it). You can do this in the shower and allow the catheter to fall out.  Ask [place] if you have any questions about the catheter management.  Remove the foley catheter after 24 hours ( day after the procedure).can be done easily by cutting the side port of the catheter, whichallow the balloon to deflate.  You will see 1-2 teaspoons of clear water as the balloon deflates and then the catheter can be slid out without difficulty.        Cut here   Diet:  You may return to your normal diet within 24 hours following your surgery. You may note some mild nausea and possibly vomiting the first 6-8 hours following surgery. This is usually due to the side effects of anesthesia, and will disappear quite soon. I would suggest clear liquids and a very light meal the first evening following your surgery.  Activity:  Your physical activity is to be restricted, especially during the first 2 weeks home. During this time use the following guidelines:  No lifting heavy objects (anything greater than 10 pounds). No driving a car and limit long car rides. No strenuous exercise, limits stairclimbing to a minimum. Do not swim or soak in a bath tub for 2 weeks. Do not place anything per vagina for 4 weeks.  Wound care: There is very little wound care.  The suprapubic incisions (above your pubic bone) are glued closed and this will peel off over the next 7-14 days.  They do not need to be covered.   The vaginal incision may ooze/bleed a little bit the first couple days after surgery.  This is normal.  It is okay to use a sanitary pad to help keep things clean.  Otherwise this incision needs no additional care.  Bowels:  You may need a  stool softener and. A bowel movement every other day is reasonable. Use a mild laxative if needed, such as milk of magnesia 2-3 tablespoons, or 2 Dulcolax tablets. Call if you continue to have problems. If you had been taking narcotics for pain, before, during or after your surgery, you may be constipated. Take a laxative if necessary.  Medication:  You should resume your pre-surgery medications unless told not to. In addition you may be given an antibiotic to prevent or treat infection. Antibiotics are not always necessary. Pain pills (Tramadol) may also be given to help with the incision and catheter discomfort. Tylenol (acetaminophen) or Advil (ibuprofen) which have no narcotics are better if the pain is not too bad. All medication should be taken as prescribed until the bottles are finished unless you are having an unusual reaction to one of the drugs.  Problems you should report to [place]:  a. Fever greater than 101F. b. Heavy bleeding, or clots (see notes above about blood in urine). c. Inability to urinate. d. Drug reactions (hives, rash, nausea, vomiting, diarrhea). e. Severe burning or pain with urination that is not improving.

## 2023-06-03 NOTE — Transfer of Care (Signed)
Immediate Anesthesia Transfer of Care Note  Patient: Renee Mendez  Procedure(s) Performed: CYSTOSCOPY MID-URETHRAL SLING-LYNX  Patient Location: PACU  Anesthesia Type:General  Level of Consciousness: awake and patient cooperative  Airway & Oxygen Therapy: Patient Spontanous Breathing and Patient connected to face mask  Post-op Assessment: Report given to RN and Post -op Vital signs reviewed and stable  Post vital signs: Reviewed and stable  Last Vitals:  Vitals Value Taken Time  BP 148/87 06/03/23 1623  Temp    Pulse 85 06/03/23 1625  Resp 14 06/03/23 1625  SpO2 94 % 06/03/23 1625  Vitals shown include unfiled device data.  Last Pain:  Vitals:   06/03/23 1316  TempSrc: Oral  PainSc: 0-No pain         Complications: No notable events documented.

## 2023-06-03 NOTE — Interval H&P Note (Signed)
History and Physical Interval Note:  06/03/2023 2:10 PM  Renee Mendez  has presented today for surgery, with the diagnosis of STRESS URINARY INCONTINENCE.  The various methods of treatment have been discussed with the patient and family. After consideration of risks, benefits and other options for treatment, the patient has consented to  Procedure(s) with comments: CYSTOSCOPY (N/A) - 90 MINUTES MID-URETHRAL SLING-LYNX (N/A) as a surgical intervention.  The patient's history has been reviewed, patient examined, no change in status, stable for surgery.  I have reviewed the patient's chart and labs.  Questions were answered to the patient's satisfaction.     Shirline Kendle D Pleas Carneal

## 2023-06-04 ENCOUNTER — Encounter (HOSPITAL_COMMUNITY): Payer: Self-pay | Admitting: Urology

## 2023-06-04 NOTE — Anesthesia Postprocedure Evaluation (Signed)
Anesthesia Post Note  Patient: Arvena Calamari Dexheimer  Procedure(s) Performed: CYSTOSCOPY MID-URETHRAL SLING-LYNX     Patient location during evaluation: PACU Anesthesia Type: General Level of consciousness: awake and alert Pain management: pain level controlled Vital Signs Assessment: post-procedure vital signs reviewed and stable Respiratory status: spontaneous breathing, nonlabored ventilation, respiratory function stable and patient connected to nasal cannula oxygen Cardiovascular status: blood pressure returned to baseline and stable Postop Assessment: no apparent nausea or vomiting Anesthetic complications: no   No notable events documented.  Last Vitals:  Vitals:   06/03/23 1815 06/03/23 1920  BP: 138/80 129/73  Pulse: 77 78  Resp: 14 13  Temp: 36.7 C 37 C  SpO2: 100% 95%    Last Pain:  Vitals:   06/03/23 1920  TempSrc:   PainSc: 0-No pain                 Shelton Silvas

## 2023-06-09 ENCOUNTER — Ambulatory Visit (HOSPITAL_COMMUNITY): Payer: No Typology Code available for payment source

## 2023-06-10 ENCOUNTER — Ambulatory Visit: Payer: No Typology Code available for payment source | Attending: Family Medicine | Admitting: Audiology

## 2023-06-10 ENCOUNTER — Telehealth: Payer: Self-pay

## 2023-06-10 DIAGNOSIS — H90A12 Conductive hearing loss, unilateral, left ear with restricted hearing on the contralateral side: Secondary | ICD-10-CM | POA: Insufficient documentation

## 2023-06-10 DIAGNOSIS — H938X2 Other specified disorders of left ear: Secondary | ICD-10-CM | POA: Diagnosis present

## 2023-06-10 NOTE — Telephone Encounter (Signed)
Faxed notes for PA [phone] Pending# [id number] for CPT [id number]

## 2023-06-11 ENCOUNTER — Telehealth: Payer: Self-pay

## 2023-06-11 NOTE — Telephone Encounter (Signed)
-----   Message from [place] T sent at 06/05/2023  5:29 PM EDT ----- Regarding: Leota Sauers from [facility] Melanie from [facility] called regarding CT Scan schedule for 8/19.  She would like a call back at [phone] ext [phone]

## 2023-06-11 NOTE — Telephone Encounter (Signed)
Received fax from [facility] indicating case has been sent to a MD for further review.

## 2023-06-11 NOTE — Procedures (Signed)
  Outpatient Audiology and [facility] [address] [place], [place]  [address] [phone]  AUDIOLOGICAL  EVALUATION  NAME: Renee Mendez     DOB:   Mar 03, 1971      MRN: [id number]                                                                                     DATE: 06/11/2023     REFERENT: Deatra James, MD STATUS: Outpatient DIAGNOSIS: Conductive hearing loss, left ear   History: Peg was seen for an audiological evaluation.  [name] has a history of ear surgeries as a child.  [name] reports she has had 3 left ear surgeries for tympanic membrane reconstruction.  She reports decreased hearing in the left ear and no hearing concerns in the right ear.  [name] reports left aural fullness.  She reports bilateral tinnitus which is non bothersome.  [name] reports in June she had a left ear infection and sinus infection and is concerned her hearing has decreased and is concerned she has a perforated eardrum. [name] is followed by Dr. Irene Pap at [facility].   Evaluation:  Otoscopy showed a clear view of the tympanic membranes, bilaterally Tympanometry results were consistent with normal middle ear pressure and normal tympanic membrane mobility (Type A), bilaterally Audiometric testing was completed using Conventional Audiometry techniques with insert earphones and TDH headphones. Test results are consistent in the right ear with essentially normal hearing sensitivity with the exception of a mild hearing loss at 6000 Hz and are consistent in the left ear with a mild conductive hearing loss. Speech Recognition Thresholds were obtained at 15 dB HL in the right ear and at 30  dB HL in the left ear. Word Recognition Testing was completed at 70 dB HL and [name] scored 100%, bilaterally.      Results:  The test results were reviewed with [name].  Today's test results are consistent with normal tympanic membrane mobility in both ears. Audiometric Test results are  consistent in the right ear with essentially normal hearing sensitivity with the exception of a mild hearing loss at 6000 Hz and are consistent in the left ear with a mild conductive hearing loss.  [name] will have hearing and communication difficulties in adverse listening environments.  She will benefit from the use of good communication strategies and the use of amplification for that left ear.  [name] was counseled regarding ENT follow up.   Recommendations: 1.   Follow up with Dr. Irene Pap 2.   Communication needs assessment with an audiologist to discuss amplification which includes a traditional hearing aid or bone-anchored hearing aid if motivated   30 minutes spent testing and counseling on results.   If you have any questions please feel free to contact me at [phone].  [name] Audiologist, Au.D., CCC-A 06/11/2023  1:45 PM  Cc: Deatra James, MD

## 2023-06-11 NOTE — Telephone Encounter (Signed)
Renee Mendez, LMVM CT is having to be Pre-Authorized and reviewed by a nurse with [employer].

## 2023-06-17 ENCOUNTER — Ambulatory Visit (HOSPITAL_COMMUNITY)
Admission: RE | Admit: 2023-06-17 | Discharge: 2023-06-17 | Disposition: A | Discharge status: Home / Self Care | Payer: No Typology Code available for payment source | Source: Ambulatory Visit | Attending: Otolaryngology | Admitting: Otolaryngology

## 2023-06-17 DIAGNOSIS — H663X2 Other chronic suppurative otitis media, left ear: Secondary | ICD-10-CM | POA: Insufficient documentation

## 2023-07-02 ENCOUNTER — Telehealth: Payer: Self-pay

## 2023-07-02 NOTE — Telephone Encounter (Signed)
error 

## 2023-07-02 NOTE — Telephone Encounter (Signed)
Called radiology, spoke with Morrie Sheldon. Requested for CT done on 06/17/2023 to be signed off.

## 2023-08-06 ENCOUNTER — Encounter (INDEPENDENT_AMBULATORY_CARE_PROVIDER_SITE_OTHER): Payer: Self-pay | Admitting: Otolaryngology

## 2023-08-06 ENCOUNTER — Ambulatory Visit (INDEPENDENT_AMBULATORY_CARE_PROVIDER_SITE_OTHER): Payer: No Typology Code available for payment source | Admitting: Otolaryngology

## 2023-08-06 VITALS — BP 151/87 | HR 75 | Ht 65.0 in | Wt 183.2 lb

## 2023-08-06 DIAGNOSIS — H9012 Conductive hearing loss, unilateral, left ear, with unrestricted hearing on the contralateral side: Secondary | ICD-10-CM

## 2023-08-06 DIAGNOSIS — H9312 Tinnitus, left ear: Secondary | ICD-10-CM

## 2023-08-06 DIAGNOSIS — H6993 Unspecified Eustachian tube disorder, bilateral: Secondary | ICD-10-CM

## 2023-08-06 DIAGNOSIS — J3489 Other specified disorders of nose and nasal sinuses: Secondary | ICD-10-CM

## 2023-08-06 DIAGNOSIS — J3089 Other allergic rhinitis: Secondary | ICD-10-CM

## 2023-08-06 DIAGNOSIS — R0982 Postnasal drip: Secondary | ICD-10-CM

## 2023-08-06 DIAGNOSIS — H9319 Tinnitus, unspecified ear: Secondary | ICD-10-CM

## 2023-08-06 DIAGNOSIS — J343 Hypertrophy of nasal turbinates: Secondary | ICD-10-CM | POA: Diagnosis not present

## 2023-08-06 DIAGNOSIS — J342 Deviated nasal septum: Secondary | ICD-10-CM

## 2023-08-06 DIAGNOSIS — R0981 Nasal congestion: Secondary | ICD-10-CM | POA: Diagnosis not present

## 2023-08-06 NOTE — Progress Notes (Unsigned)
ENT Progress Note:  Update 08/06/23: She returns for follow-up after CT T-bone and audiogram.  No new symptoms or changes in her hearing.  She took Z-Pak and Medrol Dosepak and feels that it relieved her symptoms of nasal congestion and ear fullness.  She started allergy medication and nasal spray.   Initial evaluation  Reason for Consult: Ear infections, hearing changes, URI vs sinus infection  HPI: Renee Mendez is an 52 y.o. female with hx left TM surgery (reconstruction) x 3 times when she was a child, here for initial evaluation following an ear infection and some hearing changes.  In June of 2024 she developed URI symptoms and sinus infection with left ear infection, otorrhea/ear pressure/pain on the left. She was seen by PCP and was given oral abx x 1 week, which did not help. She had nasal congestion and sinus pressure x 4 weeks, but her sx are now better. She gets recurrent sinus infections always involving left side. She had two courses of antibiotics ~ 12 months. No sinus surgeries. She denies heartburn. She is on Claritin as needed and nasal sprays as needed.     Records Reviewed:  Office visit note 04/22/2023 seen in follow-up for acute right DVT Hematological/Oncological History 12/25/2021: Presented with swelling, redness and stinging of right lower extremity. Doppler US showed acute DVT involving right common femoral vein and SF junction. Additional there was acute superficial vein thrombosis involving right great saphenous vein. Started on Xarelto therapy and discontinued estrogen birth control.  01/24/2023: Establish care with [facility]    Past Medical History:  Diagnosis Date   Allergy    Anxiety    Concussion 2015   no residual   Headache    Hypothyroidism    Right leg DVT (HCC) 12/26/2022   SUI (stress urinary incontinence, female)    Wears dentures upper, lower partial plate    Wears glasses     Past Surgical History:  Procedure Laterality Date    CYSTOSCOPY N/A 06/03/2023   Procedure: CYSTOSCOPY;  Surgeon: Noel Christmas, MD;  Location: [facility];  Service: Urology;  Laterality: N/A;  90 MINUTES   Eardrum Surgery     x3, left as child   PUBOVAGINAL SLING N/A 06/03/2023   Procedure: MID-URETHRAL SLING-LYNX;  Surgeon: Noel Christmas, MD;  Location: [facility];  Service: Urology;  Laterality: N/A;   WISDOM TOOTH EXTRACTION      Family History  Problem Relation Age of Onset   Graves' disease Mother    Colon polyps Mother    Hypothyroidism Maternal Grandmother    Deep vein thrombosis Maternal Grandmother    Brain cancer Paternal Grandmother        Tumor   Healthy Son    Migraines Maternal Aunt    Colon cancer Neg Hx    Esophageal cancer Neg Hx    Stomach cancer Neg Hx    Rectal cancer Neg Hx     Social History:  reports that she has quit smoking. She has never used smokeless tobacco. She reports that she does not currently use alcohol. She reports that she does not use drugs.  Allergies:  Allergies  Allergen Reactions   Latex     Itching, red    Lubiprostone Shortness Of Breath   Amoxicillin-Pot Clavulanate Other (See Comments)    Dizziness   Clindamycin Hcl Diarrhea   Ketorolac Tromethamine Other (See Comments)    Dizziness, felt sick   Tapazole [Methimazole]     Severe joint pain  Cefuroxime Axetil Rash    Medications: I have reviewed the patient's current medications.  The PMH, PSH, Medications, Allergies, and SH were reviewed and updated.  ROS: Constitutional: Negative for fever, weight loss and weight gain. Cardiovascular: Negative for chest pain and dyspnea on exertion. Respiratory: Is not experiencing shortness of breath at rest. Gastrointestinal: Negative for nausea and vomiting. Neurological: Negative for headaches. Psychiatric: The patient is not nervous/anxious  Blood pressure (!) 151/87, pulse 75, height 5\' 5"  (1.651 m), weight 183 lb 3.2 oz (83.1 kg), SpO2 97%.  PHYSICAL EXAM:  Exam: General:  Well-developed, well-nourished Respiratory Respiratory effort: Equal inspiration and expiration without stridor Cardiovascular Peripheral Vascular: Warm extremities with equal color/perfusion Eyes: No nystagmus with equal extraocular motion bilaterally Neuro/Psych/Balance: Patient oriented to person, place, and time; Appropriate mood and affect; Gait is intact with no imbalance; Cranial nerves I-XII are intact Head and Face Inspection: Normocephalic and atraumatic without mass or lesion Facial Strength: Facial motility symmetric and full bilaterally ENT Pinna: External ear intact and fully developed External canal: Canal is patent with intact skin Tympanic Membrane: Clear bilaterally, left TM is thickened, with retraction, no obvious TM perforation noted, EAC's appear dry.  External Nose: No scar or anatomic deformity Internal Nose: Septum is deviated to the left on anterior rhinoscopy. No polyp, or purulence. Mucosal edema and erythema present.  Bilateral inferior turbinate hypertrophy.  Lips, Teeth, and gums: Mucosa and teeth intact and viable TMJ: No pain to palpation with full mobility Oral cavity/oropharynx: No erythema or exudate, no lesions present Neck Neck and Trachea: Midline trachea without mass or lesion  Procedure performed during last office visit:  PROCEDURE NOTE: nasal endoscopy  Preoperative diagnosis: chronic sinusitis symptoms  Postoperative diagnosis: same  Procedure: Diagnostic nasal endoscopy (38756)  Surgeon: Ashok Croon, M.D.  Anesthesia: Topical lidocaine and Afrin  H&P REVIEW: The patient's history and physical were reviewed today prior to procedure. All medications were reviewed and updated as well. Complications: None Condition is stable throughout exam Indications and consent: The patient presents with symptoms of chronic sinusitis not responding to previous therapies. All the risks, benefits, and potential complications were reviewed with the  patient preoperatively and informed consent was obtained. The time out was completed with confirmation of the correct procedure.   Procedure: The patient was seated upright in the clinic. Topical lidocaine and Afrin were applied to the nasal cavity. After adequate anesthesia had occurred, the rigid nasal endoscope was passed into the nasal cavity. The nasal mucosa, turbinates, septum, and sinus drainage pathways were visualized bilaterally. This revealed no purulence or significant secretions that might be cultured. There were no polyps or sites of significant inflammation. The mucosa was intact and there was no crusting present. The scope was then slowly withdrawn and the patient tolerated the procedure well. There were no complications or blood loss.  Studies Reviewed: CT T-bone 06/17/23 IMPRESSION: 1. Partial opacification of the left mastoid air cells. No osseous erosion. 2. Otherwise unremarkable appearance of bilateral right temporal bone. 3. Mucosal thickening in the maxillary sinuses, ethmoid air cells, and left sphenoid sinus. Mucosal thickening occludes both ostiomeatal complexes and sphenoethmoidal recesses. No evidence of osseous erosion or thickening.     Audiogram   Assessment/Plan: Encounter Diagnoses  Name Primary?   Post-nasal drip    Dysfunction of both eustachian tubes    Environmental and seasonal allergies    Nasal septal deviation    Hypertrophy of both inferior nasal turbinates    Chronic nasal congestion Yes   Conductive  hearing loss of left ear with unrestricted hearing of right ear    Tinnitus, unspecified laterality     52 year old female with history of 3 reported ear surgeries when she was young with a goal of left eardrum reconstruction per report, history of nasal congestion and seasonal allergies currently on Claritin and Flonase as needed, who is here after an episode of URI and left ear infection with ear pressure discomfort and otorrhea.  Received  oral antibiotic for it and feels symptoms are improving.  She had nasal congestion and facial pain and pressure with her URI symptoms as well.  She reports muffled hearing on the left side since the episode which was 2 months ago, no recent audiograms.  On exam there is evidence of thickened TM on the left side and small amount of purulent drainage in the retraction pocket along pars flaccida, no obvious TM perforation seen on the left side, right TM and EAC appear clear without edema or drainage.  Nasal endoscopy portion of the exam revealed no purulence or polyps, but showed evidence of inferior turbinate hypertrophy clear secretions in the nasopharynx and mild left-sided nasal septal deviation.  Will do a trial of Medrol Dosepak and Z-Pak if her symptoms persist and continue or recur in the future.  Rx sent to her pharmacy.  I advised the patient to initiate daily antihistamine and nasal steroid spray such as Flonase.  We will obtain screening audiogram to ensure we have baseline for her hearing due to history of multiple ear surgeries in the past.  Will also obtain CT temporal bone to rule out cholesteatoma.  She will return after testing.  - take Clarinex daily and Flonase twice daily  - schedule CT Temporal bone  - Audiogram   - return after testing  Update 08/06/23 she reports that her symptoms are somewhat improved including nasal congestion and decreased hearing muffled hearing with tinnitus since she took Z-Pak and Medrol Dosepak.  CT temporal bone was done which I personally reviewed, with evidence of minimal paranasal sinus mucosal thickening, inferior turban hypertrophy, and swell body.  No evidence of chronic sinus inflammation.  She had minimal opacification in the left mastoid air cells inferiorly.  No evidence of coalescent mastoiditis or destruction of mastoid air cells.  Hearing test with evidence of essentially normal hearing on the right and left side with conductive hearing loss mostly  mild.  There was a sensorineural component bilaterally slightly worse on the left side in high frequencies.  Tympanograms were type a bilaterally.   Chronic nasal congestion with recurrent episodes of suspected sinusitis treated with multiple courses of antibiotic - CT T bone did not show any evidence of chronic sinus inflammation -There is evidence of inferior turbinate hypertrophy and swell body and I suspect that most of her symptoms are due to significant nasal obstruction in the setting of undiagnosed allergies or recurrent URIs -Continue medical management with Clarinex 5 mg daily -Continue Flonase 2 puffs bilateral nares twice daily -Will consider inferior turbinate reduction in the future if symptoms will not improve  2.  Suspected environmental allergies -Medical management as above -Will consider allergy testing in the future  3.  Conductive hearing loss left side with subjective decrease in hearing and tinnitus -Audiogram with mild conductive hearing loss on the left side and bilateral sensorineural component in high-frequency range.  Good word discrimination score overall bilaterally.  Type a tymps. 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  Middle ear is clear on the scan.  -Repeat audiogram every 6 to 12 months to monitor for any further decline in her hearing function  RTC for repeat Audio 12 months and for f/u  Ashok Croon, MD Otolaryngology [facility] Phone: [phone] Fax: [phone]    08/07/2023, 5:38 AM

## 2023-08-22 ENCOUNTER — Other Ambulatory Visit: Payer: Self-pay | Admitting: Obstetrics and Gynecology

## 2023-08-22 ENCOUNTER — Other Ambulatory Visit (HOSPITAL_COMMUNITY)
Admission: RE | Admit: 2023-08-22 | Discharge: 2023-08-22 | Disposition: A | Discharge status: Home / Self Care | Payer: No Typology Code available for payment source | Source: Ambulatory Visit | Attending: Obstetrics and Gynecology | Admitting: Obstetrics and Gynecology

## 2023-08-22 DIAGNOSIS — Z01419 Encounter for gynecological examination (general) (routine) without abnormal findings: Secondary | ICD-10-CM | POA: Insufficient documentation

## 2023-08-28 LAB — CYTOLOGY - PAP
Comment: NEGATIVE
High risk HPV: NEGATIVE

## 2024-01-20 ENCOUNTER — Other Ambulatory Visit: Payer: Self-pay | Admitting: Family Medicine

## 2024-01-20 DIAGNOSIS — Z1231 Encounter for screening mammogram for malignant neoplasm of breast: Secondary | ICD-10-CM

## 2024-03-02 ENCOUNTER — Ambulatory Visit
Admission: RE | Admit: 2024-03-02 | Discharge: 2024-03-02 | Disposition: A | Discharge status: Home / Self Care | Source: Ambulatory Visit | Attending: Family Medicine | Admitting: Family Medicine

## 2024-03-02 DIAGNOSIS — Z1231 Encounter for screening mammogram for malignant neoplasm of breast: Secondary | ICD-10-CM

## 2024-04-18 ENCOUNTER — Other Ambulatory Visit (INDEPENDENT_AMBULATORY_CARE_PROVIDER_SITE_OTHER): Payer: Self-pay | Admitting: Otolaryngology
